# Patient Record
Sex: Female | Born: 1975 | Race: White | Hispanic: No | Marital: Married | Smoking: Never smoker
Health system: Southern US, Community
[De-identification: ages and names within clinical notes are randomized; demographics above are authoritative.]

## PROBLEM LIST (undated history)

## (undated) DIAGNOSIS — E0500 Thyrotoxicosis with diffuse goiter without thyrotoxic crisis or storm: Secondary | ICD-10-CM

## (undated) DIAGNOSIS — F419 Anxiety disorder, unspecified: Secondary | ICD-10-CM

## (undated) HISTORY — PX: FOOT SURGERY: SHX648

---

## 2001-06-02 ENCOUNTER — Other Ambulatory Visit: Admission: RE | Admit: 2001-06-02 | Discharge: 2001-06-02 | Payer: Self-pay | Admitting: Obstetrics and Gynecology

## 2003-01-16 ENCOUNTER — Other Ambulatory Visit: Admission: RE | Admit: 2003-01-16 | Discharge: 2003-01-16 | Payer: Self-pay | Admitting: Obstetrics and Gynecology

## 2004-10-14 ENCOUNTER — Other Ambulatory Visit: Admission: RE | Admit: 2004-10-14 | Discharge: 2004-10-14 | Payer: Self-pay | Admitting: Obstetrics and Gynecology

## 2005-05-04 ENCOUNTER — Other Ambulatory Visit: Admission: RE | Admit: 2005-05-04 | Discharge: 2005-05-04 | Payer: Self-pay | Admitting: Obstetrics and Gynecology

## 2010-01-14 ENCOUNTER — Encounter (HOSPITAL_COMMUNITY)
Admission: RE | Admit: 2010-01-14 | Discharge: 2010-03-29 | Payer: Self-pay | Source: Home / Self Care | Attending: Family Medicine | Admitting: Family Medicine

## 2010-06-10 LAB — HCG, SERUM, QUALITATIVE: Preg, Serum: NEGATIVE

## 2012-12-07 DIAGNOSIS — D229 Melanocytic nevi, unspecified: Secondary | ICD-10-CM

## 2012-12-07 HISTORY — DX: Melanocytic nevi, unspecified: D22.9

## 2014-08-14 ENCOUNTER — Other Ambulatory Visit: Payer: Self-pay | Admitting: Obstetrics and Gynecology

## 2014-08-14 DIAGNOSIS — N6489 Other specified disorders of breast: Secondary | ICD-10-CM

## 2014-08-15 ENCOUNTER — Ambulatory Visit
Admission: RE | Admit: 2014-08-15 | Discharge: 2014-08-15 | Disposition: A | Discharge status: Home / Self Care | Payer: BLUE CROSS/BLUE SHIELD | Source: Ambulatory Visit | Attending: Obstetrics and Gynecology | Admitting: Obstetrics and Gynecology

## 2014-08-15 DIAGNOSIS — N6489 Other specified disorders of breast: Secondary | ICD-10-CM

## 2015-07-23 ENCOUNTER — Other Ambulatory Visit: Payer: Self-pay | Admitting: Family Medicine

## 2015-07-23 ENCOUNTER — Ambulatory Visit
Admission: RE | Admit: 2015-07-23 | Discharge: 2015-07-23 | Disposition: A | Discharge status: Home / Self Care | Payer: BLUE CROSS/BLUE SHIELD | Source: Ambulatory Visit | Attending: Family Medicine | Admitting: Family Medicine

## 2015-07-23 DIAGNOSIS — M25561 Pain in right knee: Secondary | ICD-10-CM

## 2015-07-23 DIAGNOSIS — M25562 Pain in left knee: Principal | ICD-10-CM

## 2016-05-13 IMAGING — CR DG KNEE COMPLETE 4+V*L*
4 series · 4 of 4 positions shown · non-contrast
Comparison: No recent prior.

CLINICAL DATA: Knee pain for several months. No known injury.
Initial evaluation .

EXAM:
LEFT KNEE - COMPLETE 4+ VIEW

[w knee ap left]
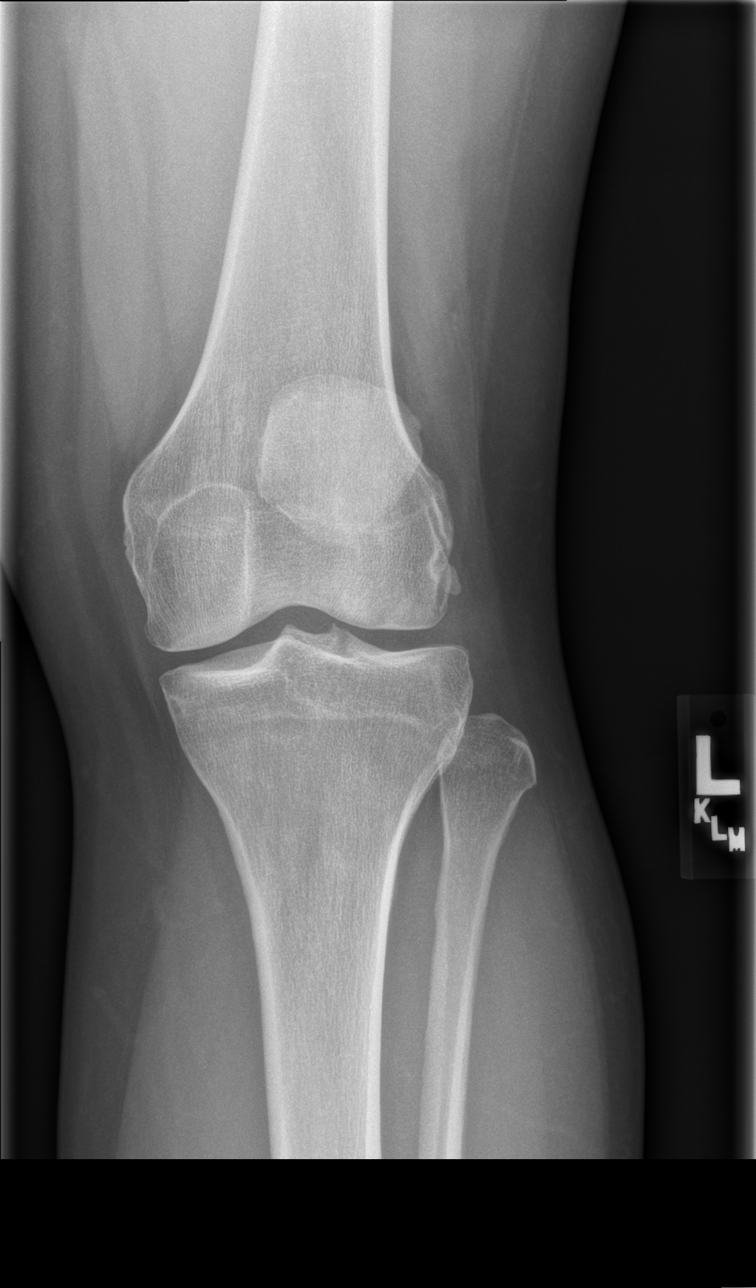

[w knee lat left]
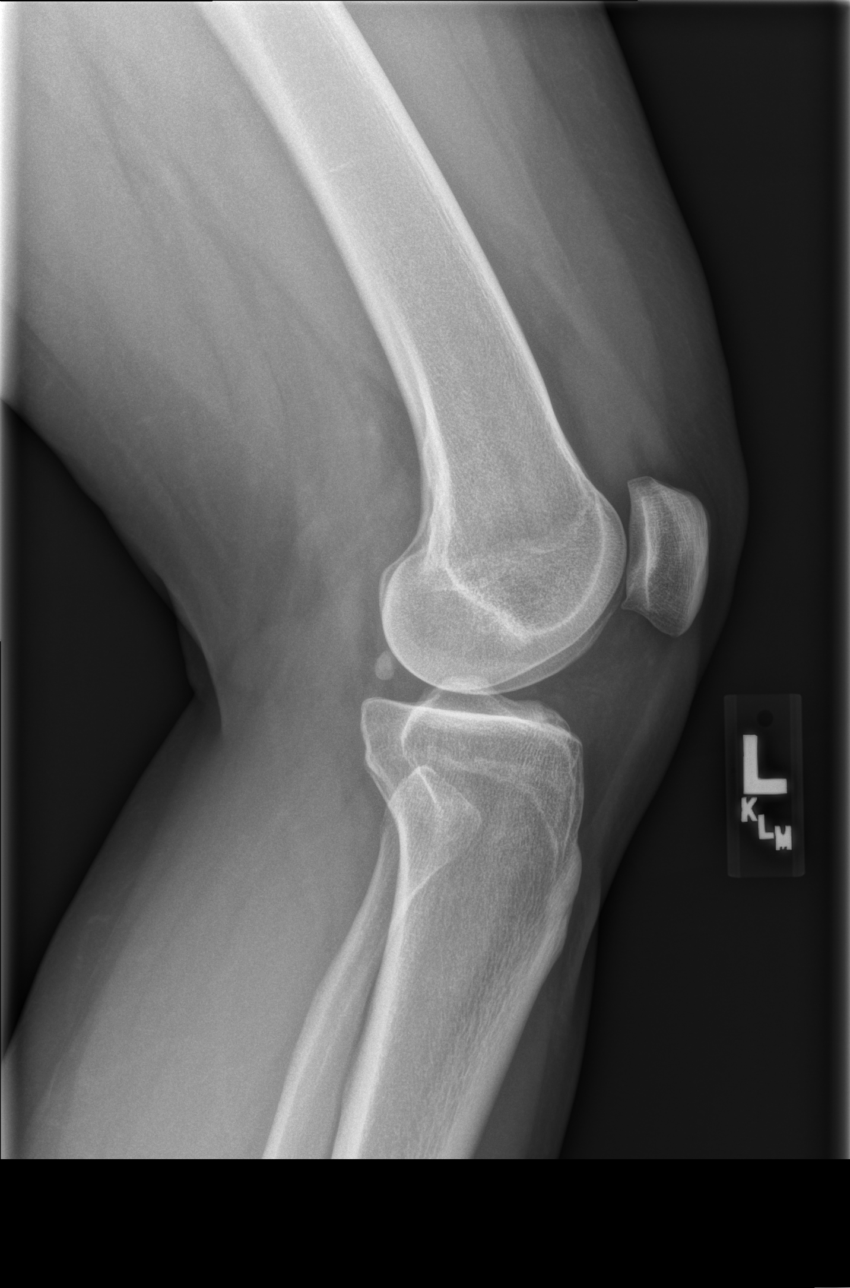

[w knee tunnel pa left]
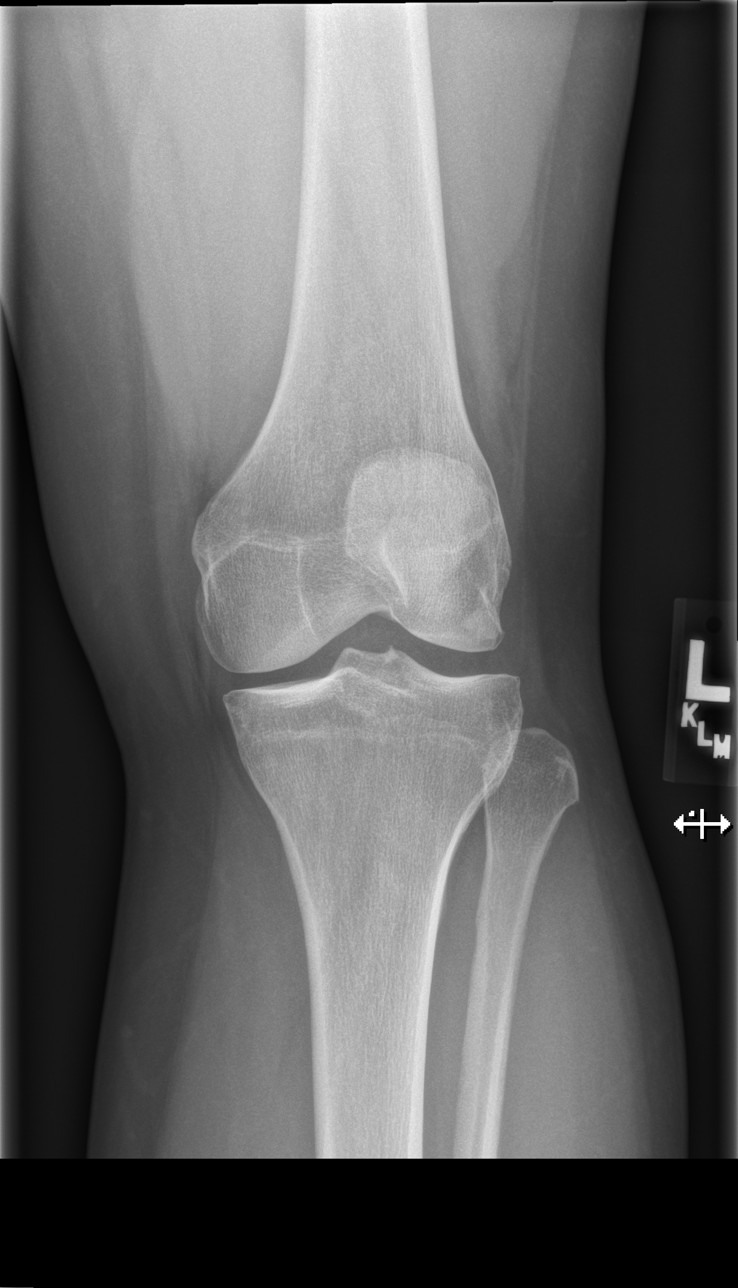

[x knee sunrise left]
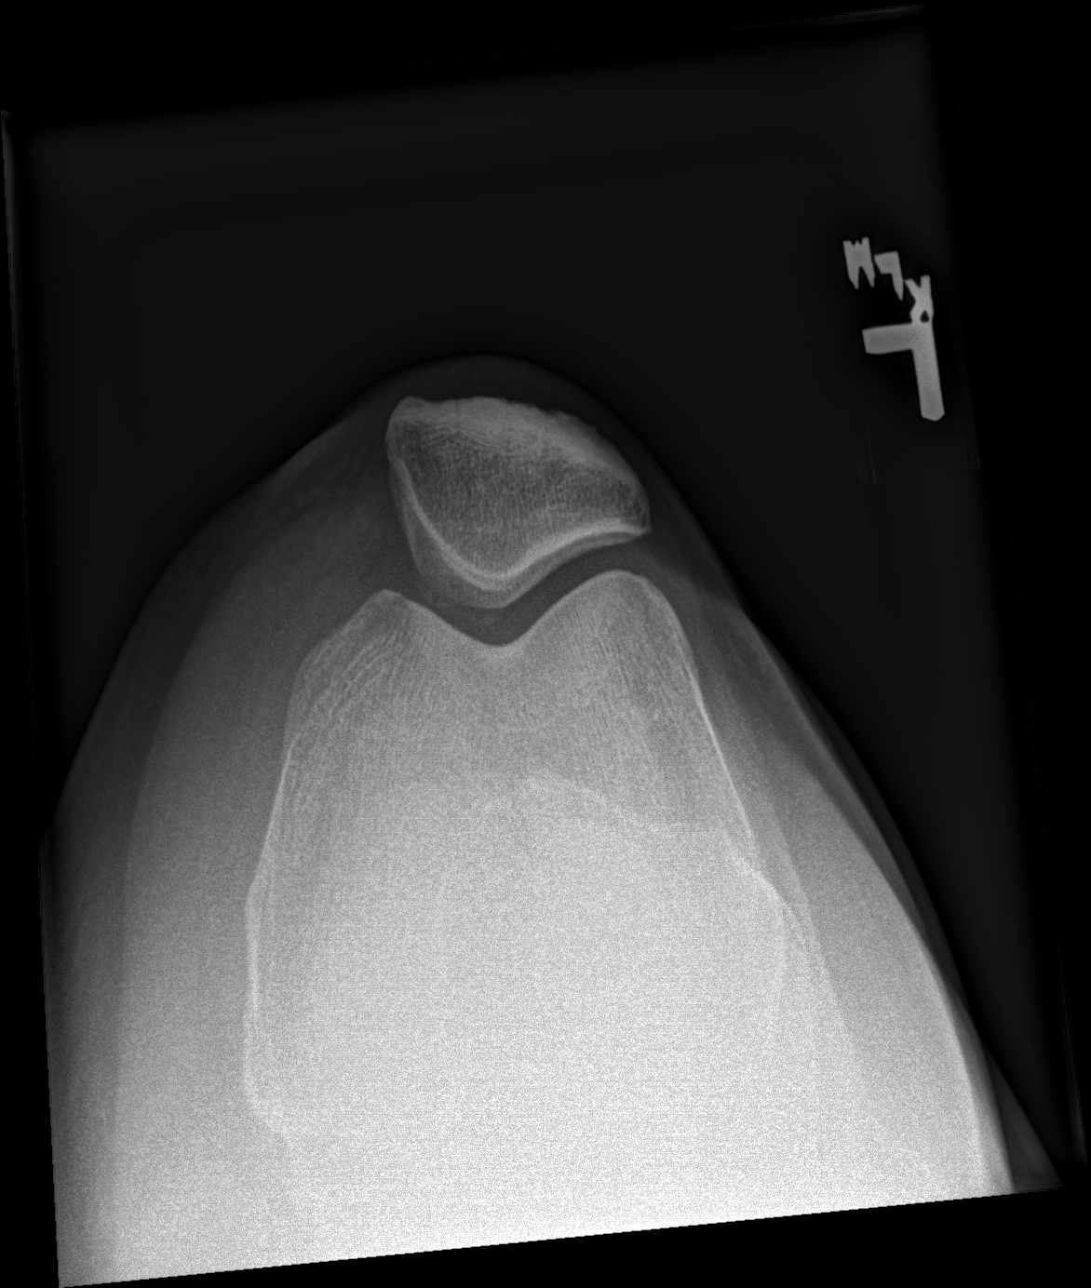

[4 of 4 positions shown; findings below may reference images not displayed]

FINDINGS: No acute bony abnormality identified. No evidence of fracture
dislocation. Small knee joint effusion. Mild patellofemoral
degenerative change pre
IMPRESSION: 1. Mild patellofemoral degenerative change. No acute bony
abnormality.

2. Small knee joint effusion.

## 2016-11-19 ENCOUNTER — Other Ambulatory Visit: Payer: Self-pay | Admitting: Obstetrics and Gynecology

## 2016-11-19 DIAGNOSIS — R928 Other abnormal and inconclusive findings on diagnostic imaging of breast: Secondary | ICD-10-CM

## 2016-12-01 ENCOUNTER — Ambulatory Visit
Admission: RE | Admit: 2016-12-01 | Discharge: 2016-12-01 | Disposition: A | Discharge status: Home / Self Care | Payer: Commercial Managed Care - PPO | Source: Ambulatory Visit | Attending: Obstetrics and Gynecology | Admitting: Obstetrics and Gynecology

## 2016-12-01 ENCOUNTER — Other Ambulatory Visit: Payer: Self-pay | Admitting: Obstetrics and Gynecology

## 2016-12-01 DIAGNOSIS — N6310 Unspecified lump in the right breast, unspecified quadrant: Secondary | ICD-10-CM

## 2016-12-01 DIAGNOSIS — R928 Other abnormal and inconclusive findings on diagnostic imaging of breast: Secondary | ICD-10-CM

## 2017-06-01 ENCOUNTER — Other Ambulatory Visit: Payer: Self-pay | Admitting: Obstetrics and Gynecology

## 2017-06-01 ENCOUNTER — Ambulatory Visit
Admission: RE | Admit: 2017-06-01 | Discharge: 2017-06-01 | Disposition: A | Discharge status: Home / Self Care | Payer: Commercial Managed Care - PPO | Source: Ambulatory Visit | Attending: Obstetrics and Gynecology | Admitting: Obstetrics and Gynecology

## 2017-06-01 DIAGNOSIS — N6310 Unspecified lump in the right breast, unspecified quadrant: Secondary | ICD-10-CM

## 2017-12-02 ENCOUNTER — Ambulatory Visit
Admission: RE | Admit: 2017-12-02 | Discharge: 2017-12-02 | Disposition: A | Discharge status: Home / Self Care | Payer: Commercial Managed Care - PPO | Source: Ambulatory Visit | Attending: Obstetrics and Gynecology | Admitting: Obstetrics and Gynecology

## 2017-12-02 ENCOUNTER — Other Ambulatory Visit: Payer: Self-pay | Admitting: Obstetrics and Gynecology

## 2017-12-02 DIAGNOSIS — N6310 Unspecified lump in the right breast, unspecified quadrant: Secondary | ICD-10-CM

## 2018-10-14 ENCOUNTER — Other Ambulatory Visit: Payer: Self-pay | Admitting: Obstetrics and Gynecology

## 2018-10-14 DIAGNOSIS — N6310 Unspecified lump in the right breast, unspecified quadrant: Secondary | ICD-10-CM

## 2018-10-19 DIAGNOSIS — E039 Hypothyroidism, unspecified: Secondary | ICD-10-CM | POA: Insufficient documentation

## 2018-10-19 DIAGNOSIS — F32A Depression, unspecified: Secondary | ICD-10-CM | POA: Insufficient documentation

## 2018-10-19 DIAGNOSIS — E0500 Thyrotoxicosis with diffuse goiter without thyrotoxic crisis or storm: Secondary | ICD-10-CM | POA: Insufficient documentation

## 2018-10-19 DIAGNOSIS — F419 Anxiety disorder, unspecified: Secondary | ICD-10-CM | POA: Insufficient documentation

## 2018-10-19 DIAGNOSIS — F329 Major depressive disorder, single episode, unspecified: Secondary | ICD-10-CM | POA: Insufficient documentation

## 2018-11-18 ENCOUNTER — Other Ambulatory Visit: Payer: Self-pay

## 2018-11-18 DIAGNOSIS — Z20822 Contact with and (suspected) exposure to covid-19: Secondary | ICD-10-CM

## 2018-11-19 LAB — NOVEL CORONAVIRUS, NAA: SARS-CoV-2, NAA: NOT DETECTED

## 2018-12-06 ENCOUNTER — Ambulatory Visit
Admission: RE | Admit: 2018-12-06 | Discharge: 2018-12-06 | Disposition: A | Discharge status: Home / Self Care | Payer: Commercial Managed Care - PPO | Source: Ambulatory Visit | Attending: Obstetrics and Gynecology | Admitting: Obstetrics and Gynecology

## 2018-12-06 ENCOUNTER — Other Ambulatory Visit: Payer: Self-pay

## 2018-12-06 DIAGNOSIS — N6310 Unspecified lump in the right breast, unspecified quadrant: Secondary | ICD-10-CM

## 2019-04-18 ENCOUNTER — Ambulatory Visit (INDEPENDENT_AMBULATORY_CARE_PROVIDER_SITE_OTHER): Payer: Commercial Managed Care - PPO

## 2019-04-18 ENCOUNTER — Encounter: Payer: Self-pay | Admitting: Podiatry

## 2019-04-18 ENCOUNTER — Other Ambulatory Visit: Payer: Self-pay

## 2019-04-18 ENCOUNTER — Ambulatory Visit (INDEPENDENT_AMBULATORY_CARE_PROVIDER_SITE_OTHER): Payer: Commercial Managed Care - PPO | Admitting: Podiatry

## 2019-04-18 VITALS — BP 109/71 | HR 72 | Resp 16

## 2019-04-18 DIAGNOSIS — M722 Plantar fascial fibromatosis: Secondary | ICD-10-CM

## 2019-04-19 NOTE — Progress Notes (Signed)
  Subjective:  Patient ID: Kathy Stone, female    DOB: Oct 16, 1975,  MRN: [id number] HPI Chief Complaint  Patient presents with  . Foot Pain    Arch right - large knot x year, gotten bigger and increased discomfort, intense pain is intermittent  . New Patient (Initial Visit)    44 y.o. female presents with the above complaint.   ROS: Denies fever chills nausea vomiting muscle aches pains calf pain back pain chest pain shortness of breath.  No past medical history on file. No past surgical history on file.  Current Outpatient Medications:  .  FLUoxetine (PROZAC) 40 MG capsule, Take 40 mg by mouth daily., Disp: , Rfl:  .  levothyroxine (SYNTHROID) 100 MCG tablet, Take 100 mcg by mouth daily., Disp: , Rfl:  .  liothyronine (CYTOMEL) 5 MCG tablet, Take 5 mcg by mouth daily., Disp: , Rfl:   No Known Allergies Review of Systems Objective:   Vitals:   04/18/19 0902  BP: 109/71  Pulse: 72  Resp: 16    General: Well developed, nourished, in no acute distress, alert and oriented x3   Dermatological: Skin is warm, dry and supple bilateral. Nails x 10 are well maintained; remaining integument appears unremarkable at this time. There are no open sores, no preulcerative lesions, no rash or signs of infection present.  Vascular: Dorsalis Pedis artery and Posterior Tibial artery pedal pulses are 2/4 bilateral with immedate capillary fill time. Pedal hair growth present. No varicosities and no lower extremity edema present bilateral.   Neruologic: Grossly intact via light touch bilateral. Vibratory intact via tuning fork bilateral. Protective threshold with Semmes Wienstein monofilament intact to all pedal sites bilateral. Patellar and Achilles deep tendon reflexes 2+ bilateral. No Babinski or clonus noted bilateral.   Musculoskeletal: No gross boney pedal deformities bilateral. No pain, crepitus, or limitation noted with foot and ankle range of motion bilateral. Muscular strength 5/5  in all groups tested bilateral.  Large nonpulsatile mass to the plantar forefoot right.  Nonpulsatile it is firm does not appear to be attached to the skin at this point.  Gait: Unassisted, Nonantalgic.    Radiographs:  Radiographs taken today demonstrate a large mass to the plantar aspect of the forefoot right.  Measuring greater than 4 cm.  No calcification.  Assessment & Plan:   Assessment: Plantar fibromatosis right foot.  Plan: Discussed etiology pathology conservative versus surgical therapy at this point I feel that we should try an injection which we did today with 20 mg of Kenalog 5 mg Marcaine to the lesion itself I will follow-up with her in 4 to 6 weeks.     Noris Kulinski T. [place], [place]

## 2019-06-06 ENCOUNTER — Ambulatory Visit: Payer: Commercial Managed Care - PPO | Admitting: Podiatry

## 2020-03-05 ENCOUNTER — Other Ambulatory Visit: Payer: Self-pay

## 2020-03-05 ENCOUNTER — Ambulatory Visit (INDEPENDENT_AMBULATORY_CARE_PROVIDER_SITE_OTHER): Payer: Commercial Managed Care - PPO | Admitting: Podiatry

## 2020-03-05 ENCOUNTER — Encounter: Payer: Self-pay | Admitting: Podiatry

## 2020-03-05 DIAGNOSIS — M722 Plantar fascial fibromatosis: Secondary | ICD-10-CM

## 2020-03-05 NOTE — Progress Notes (Signed)
She presents today states that I just am really ready to get rid of this thing as she is referring to the fibroma to the right foot. She states is starting to affect her ability to perform her daily activities and is becoming more more painful as time goes on states the injections did not help it at all.  Objective: Vital signs are stable alert oriented x3. Pulses are palpable. Large plantar fibroma to the plantar aspect medial longitudinal arch. It does encompass not only the medial but the l middle plantar fascial band. And is tender on palpation. No erythema edema cellulitis drainage or odor.  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  Plan: At this point we consented her today for excision plantar fibroma right foot. Answer all the questions regarding this procedure to the best of my ability in layman's terms she understood and was amenable to it signed a 3 page of the consent form. We did discuss possible postop complications which may include but not limited to postop pain bleeding swelling infection recurrence need further surgery overcorrection under correction loss of digit loss of limb loss of life. She understands that she will be nonweightbearing for minimum of 21 days. I will follow-up with her in the near future for surgical intervention.

## 2020-03-11 ENCOUNTER — Encounter: Payer: Self-pay | Admitting: Podiatry

## 2020-03-14 DIAGNOSIS — M79676 Pain in unspecified toe(s): Secondary | ICD-10-CM

## 2020-04-12 ENCOUNTER — Encounter: Payer: Self-pay | Admitting: Neurology

## 2020-04-18 ENCOUNTER — Other Ambulatory Visit: Payer: Self-pay | Admitting: Podiatry

## 2020-04-18 ENCOUNTER — Telehealth: Payer: Self-pay

## 2020-04-18 MED ORDER — OXYCODONE-ACETAMINOPHEN 10-325 MG PO TABS: 1.0000 | ORAL_TABLET | Freq: Three times a day (TID) | ORAL | 0 refills | Status: AC | PRN

## 2020-04-18 MED ORDER — CEPHALEXIN 500 MG PO CAPS: 500.0000 mg | ORAL_CAPSULE | Freq: Three times a day (TID) | ORAL | 0 refills | Status: DC

## 2020-04-18 MED ORDER — ONDANSETRON HCL 4 MG PO TABS: 4.0000 mg | ORAL_TABLET | Freq: Three times a day (TID) | ORAL | 0 refills | Status: DC | PRN

## 2020-04-18 NOTE — Telephone Encounter (Signed)
DOS 04/19/2020  PLANTAR FIBROMA RT - [id number]  [facility] EFFECTIVE DATE - 09/28/2019  PLAN DEDUCTIBLE - $1500.00 W/ $1445.00 REMAINING OUT OF POCKET - $4000.00 W/ $3945.00 REMAINING COPAY $0.00 COINSURANCE - 80%  SPOKE TO PAUL T. ON 04/18/2020- AUTH # [id number] APPROVED FROM 04/19/2020 - 05/18/2020

## 2020-04-19 DIAGNOSIS — M722 Plantar fascial fibromatosis: Secondary | ICD-10-CM

## 2020-04-24 ENCOUNTER — Encounter: Payer: Self-pay | Admitting: Podiatry

## 2020-04-25 ENCOUNTER — Ambulatory Visit (INDEPENDENT_AMBULATORY_CARE_PROVIDER_SITE_OTHER): Payer: Commercial Managed Care - PPO | Admitting: Podiatry

## 2020-04-25 ENCOUNTER — Other Ambulatory Visit: Payer: Self-pay

## 2020-04-25 ENCOUNTER — Encounter: Payer: Self-pay | Admitting: Podiatry

## 2020-04-25 DIAGNOSIS — M722 Plantar fascial fibromatosis: Secondary | ICD-10-CM

## 2020-04-25 DIAGNOSIS — Z9889 Other specified postprocedural states: Secondary | ICD-10-CM

## 2020-04-26 NOTE — Progress Notes (Signed)
She presents today for her first postop visit date of surgery April 19, 2020 excision of plantar fibroma of the right foot.  She states is doing pretty well she denies having to take any oral medications at all she states that she has had no pain whatsoever.  Continues to utilize her crutches and knee scooter.  Objective: Vital signs are stable she is alert oriented x3 dress sterile dressing intact once removed demonstrates no erythema no edema cellulitis drainage odor staples and sutures are intact.  Margins are well coapted.  Pathology has come back positive for plantar fibromatosis no malignancy.  Assessment: Well-healing surgical foot excision plantar fibroma per pathology.  Plan: Redressed today dressed a compressive dressing follow-up with her in 1 to 2weeks for reevaluation sutures will not be removed until 21 days postoperatively.

## 2020-05-02 ENCOUNTER — Other Ambulatory Visit: Payer: Self-pay

## 2020-05-02 ENCOUNTER — Ambulatory Visit (INDEPENDENT_AMBULATORY_CARE_PROVIDER_SITE_OTHER): Payer: Commercial Managed Care - PPO | Admitting: Podiatry

## 2020-05-02 DIAGNOSIS — M722 Plantar fascial fibromatosis: Secondary | ICD-10-CM

## 2020-05-02 DIAGNOSIS — Z9889 Other specified postprocedural states: Secondary | ICD-10-CM

## 2020-05-05 ENCOUNTER — Encounter: Payer: Self-pay | Admitting: Podiatry

## 2020-05-05 NOTE — Progress Notes (Signed)
  Subjective:  Patient ID: Kathy Stone, female    DOB: [birth year],  MRN: [id number]  Chief Complaint  Patient presents with  . Routine Post Op    PT stated that she is doing well she has no major concerns at this time.    DOS: 04/19/2020 Procedure: Excision of plantar fibroma of right foot  45 y.o. female returns for post-op check.  Doing well, minimal pain  Review of Systems: Negative except as noted in the HPI. Denies N/V/F/Ch.   Objective:  There were no vitals filed for this visit. There is no height or weight on file to calculate BMI. Constitutional Well developed. Well nourished.  Vascular Foot warm and well perfused. Capillary refill normal to all digits.   Neurologic Normal speech. Oriented to person, place, and time. Epicritic sensation to light touch grossly present bilaterally.  Dermatologic Skin healing well without signs of infection. Skin edges well coapted without signs of infection.  Orthopedic: Tenderness to palpation noted about the surgical site.    Assessment:   1. Plantar fascial fibromatosis of right foot   2. Status post right foot surgery    Plan:  Patient was evaluated and treated and all questions answered.  S/p foot surgery right -Progressing as expected post-operatively. -WB Status: NWB  -Sutures: Sutures and staples to be left intact per Dr. Milinda Pointer until next week. -Medications: No refills required -Foot redressed.  Return in about 1 week (around 05/09/2020) for suture removal with Dr Milinda Pointer.

## 2020-05-09 ENCOUNTER — Encounter: Payer: Self-pay | Admitting: Podiatry

## 2020-05-09 ENCOUNTER — Ambulatory Visit (INDEPENDENT_AMBULATORY_CARE_PROVIDER_SITE_OTHER): Payer: Commercial Managed Care - PPO | Admitting: Podiatry

## 2020-05-09 ENCOUNTER — Other Ambulatory Visit: Payer: Self-pay | Admitting: Obstetrics and Gynecology

## 2020-05-09 ENCOUNTER — Other Ambulatory Visit: Payer: Self-pay

## 2020-05-09 DIAGNOSIS — R928 Other abnormal and inconclusive findings on diagnostic imaging of breast: Secondary | ICD-10-CM

## 2020-05-09 DIAGNOSIS — Z9889 Other specified postprocedural states: Secondary | ICD-10-CM

## 2020-05-09 DIAGNOSIS — M722 Plantar fascial fibromatosis: Secondary | ICD-10-CM

## 2020-05-11 NOTE — Progress Notes (Signed)
She presents today for follow-up of excision plantar fibroma date of surgery was 04/19/2020.  She states that is feeling okay just a little numbness in areas but all in all he feels great.  Objective: Vital signs are stable she is alert oriented times 3 sutures are intact staples are intact margins well coapted we removed all of the staples and sutures today margins remain well coapted.  Assessment: Well-healing excision plantar fibroma right foot.  Plan: I will allow her to start getting this wet tomorrow a lot of the scab material will most likely fall off I see no signs of infection whatsoever I did provide her with a compression anklet to keep the edema down that will certainly come with her ambulating.  I did decide to let her start ambulating with her cam walker initially progressing to a tennis shoe and I will follow-up with her in 2 or 3 weeks

## 2020-05-15 ENCOUNTER — Encounter: Payer: Self-pay | Admitting: Podiatry

## 2020-05-16 ENCOUNTER — Encounter: Payer: Commercial Managed Care - PPO | Admitting: Podiatry

## 2020-05-22 ENCOUNTER — Ambulatory Visit
Admission: RE | Admit: 2020-05-22 | Discharge: 2020-05-22 | Disposition: A | Discharge status: Home / Self Care | Payer: Commercial Managed Care - PPO | Source: Ambulatory Visit | Attending: Obstetrics and Gynecology | Admitting: Obstetrics and Gynecology

## 2020-05-22 ENCOUNTER — Other Ambulatory Visit: Payer: Self-pay | Admitting: Obstetrics and Gynecology

## 2020-05-22 ENCOUNTER — Ambulatory Visit: Payer: Commercial Managed Care - PPO

## 2020-05-22 ENCOUNTER — Other Ambulatory Visit: Payer: Self-pay

## 2020-05-22 DIAGNOSIS — R928 Other abnormal and inconclusive findings on diagnostic imaging of breast: Secondary | ICD-10-CM

## 2020-05-24 ENCOUNTER — Other Ambulatory Visit: Payer: Self-pay

## 2020-05-24 ENCOUNTER — Ambulatory Visit
Admission: RE | Admit: 2020-05-24 | Discharge: 2020-05-24 | Disposition: A | Discharge status: Home / Self Care | Payer: Commercial Managed Care - PPO | Source: Ambulatory Visit | Attending: Obstetrics and Gynecology | Admitting: Obstetrics and Gynecology

## 2020-05-24 ENCOUNTER — Other Ambulatory Visit: Payer: Self-pay | Admitting: Obstetrics and Gynecology

## 2020-05-24 DIAGNOSIS — R928 Other abnormal and inconclusive findings on diagnostic imaging of breast: Secondary | ICD-10-CM

## 2020-05-30 ENCOUNTER — Ambulatory Visit (INDEPENDENT_AMBULATORY_CARE_PROVIDER_SITE_OTHER): Payer: Commercial Managed Care - PPO | Admitting: Podiatry

## 2020-05-30 ENCOUNTER — Encounter: Payer: Self-pay | Admitting: Podiatry

## 2020-05-30 ENCOUNTER — Other Ambulatory Visit: Payer: Self-pay

## 2020-05-30 DIAGNOSIS — Z481 Encounter for planned postprocedural wound closure: Secondary | ICD-10-CM

## 2020-05-30 DIAGNOSIS — Z9889 Other specified postprocedural states: Secondary | ICD-10-CM

## 2020-05-30 DIAGNOSIS — M722 Plantar fascial fibromatosis: Secondary | ICD-10-CM

## 2020-05-30 MED ORDER — DOXYCYCLINE HYCLATE 100 MG PO TABS: 100.0000 mg | ORAL_TABLET | Freq: Two times a day (BID) | ORAL | 0 refills | Status: DC

## 2020-05-30 NOTE — Progress Notes (Signed)
She presents today for follow-up of her plantar fibroma date of surgery April 20, 2019 with excision of plantar fibroma of the right foot.  States that he still got a little irritation in it and a little open area I have been keeping it clean and some Neosporin on it with a bandage.  Still this hurts to walk on it though.  Objective: Vital signs are stable she is alert oriented x3 pulses are palpable in the central aspect of her incision to the plantar aspect of the medial longitudinal arch does demonstrate about a 2 cm opening which does not demonstrate any granulation tissue no purulence no malodor does not probe deep.  This possibly was a small area of involution that is just not closed up in the incision yet.  Assessment: Delayed wound healing status post excision plantar fibroma right foot.  Plan: At this point we will ask her to start putting a small amount of Betadine in the wound.  This should help dry that out considerably.  Bring the granulation tissue more superficial for epithelialization.  I will follow-up with her in a couple weeks to make sure that were not going have to get reexcise this.

## 2020-06-04 ENCOUNTER — Other Ambulatory Visit: Payer: Self-pay | Admitting: General Surgery

## 2020-06-04 DIAGNOSIS — R928 Other abnormal and inconclusive findings on diagnostic imaging of breast: Secondary | ICD-10-CM

## 2020-06-05 ENCOUNTER — Other Ambulatory Visit: Payer: Self-pay | Admitting: General Surgery

## 2020-06-05 DIAGNOSIS — R928 Other abnormal and inconclusive findings on diagnostic imaging of breast: Secondary | ICD-10-CM

## 2020-06-13 ENCOUNTER — Encounter: Payer: Commercial Managed Care - PPO | Admitting: Podiatry

## 2020-06-17 ENCOUNTER — Other Ambulatory Visit: Payer: Self-pay

## 2020-06-17 ENCOUNTER — Encounter (HOSPITAL_BASED_OUTPATIENT_CLINIC_OR_DEPARTMENT_OTHER): Payer: Self-pay | Admitting: General Surgery

## 2020-06-19 NOTE — H&P (Signed)
Kathy Stone Appointment: 06/04/2020 9:00 AM Location: [facility] Patient #: [id number] DOB: 01/26/1976 Single / Language: Kathy Stone / Race: White Female   History of Present Illness Kathy Klein MD; 06/04/2020 9:59 AM) The patient is a 45 year old female who presents with a complaint of Breast problems. Pt is a lovely 45 yo F who is referred by consultation by Dr. Shelly Bombard. She has had multiple callbacks for abnormal screening mammograms since age 51, but this is the first time she has needed a biopsy. She had a possible right breast mass this time that warranted additional information. She was found to have a 4 mm mass in the lower inner quadrant on the right. This was biopsied and was an intraductal papilloma. She denies breast pain or nipple discharge. breast density is C. She is nulliparous. The only family cancer history of which she is aware is a paternal grandmother with breast cancer in her 22s. 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   Dx mammogram/us 05/22/20 at [facility] EXAM: DIGITAL DIAGNOSTIC BILATERAL MAMMOGRAM WITH TOMOSYNTHESIS AND CAD; ULTRASOUND RIGHT BREAST LIMITED  TECHNIQUE: Bilateral digital diagnostic mammography and breast tomosynthesis was performed. The images were evaluated with computer-aided detection.; Targeted ultrasound examination of the right breast was performed  COMPARISON: Previous exam(s).  ACR Breast Density Category c: The breast tissue is heterogeneously dense, which may obscure small masses.  FINDINGS: Mammogram:  Right breast: Spot compression tomosynthesis views were performed demonstrating persistence of an oval mass measuring 0.4 cm in the lower inner quadrant.  Left breast: Spot compression tomosynthesis MLO and full field mL tomosynthesis views of the left breast performed for a questioned asymmetry seen only on the MLO view in the superior left breast. On the additional imaging the asymmetry  resolves, consistent with normal overlapping fibroglandular tissue.  Ultrasound:  Targeted ultrasound is performed in the right breast at 4 o'clock 3 cm from the nipple demonstrating a small hypoechoic mass with indistinct margins measuring 0.4 x 0.4 x 0.4 cm. No internal vascularity. This corresponds to the mammographic finding.  IMPRESSION: 1. Indeterminate right breast mass at 4 o'clock measuring 0.4 cm, possibly a complicated cyst.  2. Resolution of the questioned asymmetry seen on screening mammogram in the left breast, consistent with normal tissue.  RECOMMENDATION: Ultrasound-guided right breast aspiration/possible biopsy for the mass at 4 o'clock.  I have discussed the findings and recommendations with the patient who agrees to proceed with biopsy.  BI-RADS CATEGORY 4: Suspicious.   pathology 05/24/2020 Breast, right, needle core biopsy, 4 o'clock - INTRADUCTAL PAPILLOMA WITH USUAL DUCTAL HYPERPLASIA   Past Surgical History Emeline Gins, [place]; 06/04/2020 9:01 AM) Breast Biopsy  Right. Foot Surgery  Right.  Diagnostic Studies History Emeline Gins, [place]; 06/04/2020 9:01 AM) Colonoscopy  never Mammogram  within last year Pap Smear  1-5 years ago  Allergies Emeline Gins, CMA; 06/04/2020 9:01 AM) No Known Drug Allergies  [06/04/2020]: Allergies Reconciled   Medication History Emeline Gins, CMA; 06/04/2020 9:02 AM) Levothyroxine Sodium (100MCG Tablet, Oral) Active. Liothyronine Sodium (5MCG Tablet, Oral) Active. FLUoxetine HCl (40MG  Capsule, Oral) Active. Vitamin D (1000UNIT Tablet, Oral) Active. Medications Reconciled  Social History Emeline Gins, [place]; 06/04/2020 9:01 AM) Alcohol use  Moderate alcohol use. Caffeine use  Coffee. No drug use  Tobacco use  Never smoker.  Family History Emeline Gins, [place]; 06/04/2020 9:01 AM) Arthritis  Family Members In General. Colon Polyps  Mother. Depression  Family Members In  General. Diabetes Mellitus  Family Members  In General. Ischemic Bowel Disease  Mother. Migraine Headache  Mother.  Pregnancy / Birth History Emeline Gins, [place]; 06/04/2020 9:01 AM) Age at menarche  50 years. Contraceptive History  Intrauterine device. Gravida  0 Para  0  Other Problems Emeline Gins, [place]; 06/04/2020 9:01 AM) Anxiety Disorder  Arthritis  Depression  Gastroesophageal Reflux Disease  Lump In Breast  Thyroid Disease     Review of Systems Emeline Gins CMA; 06/04/2020 9:01 AM) General Not Present- Appetite Loss, Chills, Fatigue, Fever, Night Sweats, Weight Gain and Weight Loss. Skin Not Present- Change in Wart/Mole, Dryness, Hives, Jaundice, New Lesions, Non-Healing Wounds, Rash and Ulcer. HEENT Present- Wears glasses/contact lenses. Not Present- Earache, Hearing Loss, Hoarseness, Nose Bleed, Oral Ulcers, Ringing in the Ears, Seasonal Allergies, Sinus Pain, Sore Throat, Visual Disturbances and Yellow Eyes. Respiratory Not Present- Bloody sputum, Chronic Cough, Difficulty Breathing, Snoring and Wheezing. Breast Present- Breast Mass. Not Present- Breast Pain, Nipple Discharge and Skin Changes. Cardiovascular Not Present- Chest Pain, Difficulty Breathing Lying Down, Leg Cramps, Palpitations, Rapid Heart Rate, Shortness of Breath and Swelling of Extremities. Gastrointestinal Not Present- Abdominal Pain, Bloating, Bloody Stool, Change in Bowel Habits, Chronic diarrhea, Constipation, Difficulty Swallowing, Excessive gas, Gets full quickly at meals, Hemorrhoids, Indigestion, Nausea, Rectal Pain and Vomiting. Female Genitourinary Not Present- Frequency, Nocturia, Painful Urination, Pelvic Pain and Urgency. Musculoskeletal Not Present- Back Pain, Joint Pain, Joint Stiffness, Muscle Pain, Muscle Weakness and Swelling of Extremities. Neurological Not Present- Decreased Memory, Fainting, Headaches, Numbness, Seizures, Tingling, Tremor, Trouble walking and  Weakness. Psychiatric Not Present- Anxiety, Bipolar, Change in Sleep Pattern, Depression, Fearful and Frequent crying. Endocrine Not Present- Cold Intolerance, Excessive Hunger, Hair Changes, Heat Intolerance, Hot flashes and New Diabetes. Hematology Not Present- Blood Thinners, Easy Bruising, Excessive bleeding, Gland problems, HIV and Persistent Infections.  Vitals Emeline Gins CMA; 06/04/2020 9:01 AM) 06/04/2020 9:01 AM Weight: 166.2 lb Height: 64in Body Surface Area: 1.81 m Body Mass Index: 28.53 kg/m  Temp.: 31F  Pulse: 77 (Regular)  BP: 118/82(Sitting, Left Arm, Standard)       Physical Exam Kathy Klein MD; 06/04/2020 10:01 AM) General Mental Status-Alert. General Appearance-Consistent with stated age. Hydration-Well hydrated. Voice-Normal.  Head and Neck Head-normocephalic, atraumatic with no lesions or palpable masses. Trachea-midline. Thyroid Gland Characteristics - normal size and consistency.  Eye Eyeball - Bilateral-Extraocular movements intact. Sclera/Conjunctiva - Bilateral-No scleral icterus.  Chest and Lung Exam Chest and lung exam reveals -quiet, even and easy respiratory effort with no use of accessory muscles and on auscultation, normal breath sounds, no adventitious sounds and normal vocal resonance. Inspection Chest Wall - Normal. Back - normal.  Breast Note: relatively dense breasts. both nipples are inverted slightly which she says has always been true. No palpable masses. no skin dimpling. no LAD. no nipple discharge   Cardiovascular Cardiovascular examination reveals -normal heart sounds, regular rate and rhythm with no murmurs and normal pedal pulses bilaterally.  Abdomen Inspection Inspection of the abdomen reveals - No Hernias. Palpation/Percussion Palpation and Percussion of the abdomen reveal - Soft, Non Tender, No Rebound tenderness, No Rigidity (guarding) and No  hepatosplenomegaly. Auscultation Auscultation of the abdomen reveals - Bowel sounds normal.  Neurologic Neurologic evaluation reveals -alert and oriented x 3 with no impairment of recent or remote memory. Mental Status-Normal.  Musculoskeletal Global Assessment -Note: no gross deformities.  Normal Exam - Left-Upper Extremity Strength Normal and Lower Extremity Strength Normal. Normal Exam - Right-Upper Extremity Strength Normal and Lower Extremity Strength Normal.  Lymphatic Head & Neck  General  Head & Neck Lymphatics: Bilateral - Description - Normal. Axillary  General Axillary Region: Bilateral - Description - Normal. Tenderness - Non Tender. Femoral & Inguinal  Generalized Femoral & Inguinal Lymphatics: Bilateral - Description - No Generalized lymphadenopathy.    Assessment & Plan Kathy Klein MD; 06/04/2020 9:54 AM) ABNORMAL MAMMOGRAM OF RIGHT BREAST (R92.8) Impression: Given pathology on core needle biopsy, will schedule seed localized excision.  The surgical procedure was described to the patient. I discussed the incision type and location and that we would need radiology involved on with a wire or seed marker and/or sentinel node.  The risks and benefits of the procedure were described to the patient and she wishes to proceed.  We discussed the risks bleeding, infection, damage to other structures, need for further procedures/surgeries. We discussed the risk of seroma. The patient was advised if the area in the breast in cancer, we may need to go back to surgery for additional tissue to obtain negative margins or for a lymph node biopsy. The patient was advised that these are the most common complications, but that others can occur as well. They were advised against taking aspirin or other anti-inflammatory agents/blood thinners the week before surgery. Current Plans You are being scheduled for surgery- Our schedulers will call you.  You should hear from our  office's scheduling department within 5 working days about the location, date, and time of surgery. We try to make accommodations for patient's preferences in scheduling surgery, but sometimes the OR schedule or the surgeon's schedule prevents [place] from making those accommodations.  If you have not heard from our office [phone]) in 5 working days, call the office and ask for your surgeon's nurse.  If you have other questions about your diagnosis, plan, or surgery, call the office and ask for your surgeon's nurse.  Pt Education - [facility] Breast Biopsy HCI: discussed with patient and provided information.   Signed electronically by Kathy Klein, MD (06/04/2020 10:01 AM)

## 2020-06-20 ENCOUNTER — Other Ambulatory Visit (HOSPITAL_COMMUNITY)
Admission: RE | Admit: 2020-06-20 | Discharge: 2020-06-20 | Disposition: A | Discharge status: Home / Self Care | Payer: Commercial Managed Care - PPO | Source: Ambulatory Visit | Attending: General Surgery | Admitting: General Surgery

## 2020-06-20 DIAGNOSIS — Z20822 Contact with and (suspected) exposure to covid-19: Secondary | ICD-10-CM | POA: Insufficient documentation

## 2020-06-20 DIAGNOSIS — Z01812 Encounter for preprocedural laboratory examination: Secondary | ICD-10-CM | POA: Insufficient documentation

## 2020-06-20 LAB — SARS CORONAVIRUS 2 (TAT 6-24 HRS): SARS Coronavirus 2: NEGATIVE

## 2020-06-21 ENCOUNTER — Other Ambulatory Visit: Payer: Self-pay

## 2020-06-21 ENCOUNTER — Ambulatory Visit
Admission: RE | Admit: 2020-06-21 | Discharge: 2020-06-21 | Disposition: A | Discharge status: Home / Self Care | Payer: Commercial Managed Care - PPO | Source: Ambulatory Visit | Attending: General Surgery | Admitting: General Surgery

## 2020-06-21 DIAGNOSIS — R928 Other abnormal and inconclusive findings on diagnostic imaging of breast: Secondary | ICD-10-CM

## 2020-06-21 NOTE — Progress Notes (Signed)

## 2020-06-24 ENCOUNTER — Ambulatory Visit (HOSPITAL_BASED_OUTPATIENT_CLINIC_OR_DEPARTMENT_OTHER)
Admission: RE | Admit: 2020-06-24 | Discharge: 2020-06-24 | Disposition: A | Discharge status: Home / Self Care | Payer: Commercial Managed Care - PPO | Attending: General Surgery | Admitting: General Surgery

## 2020-06-24 ENCOUNTER — Encounter (HOSPITAL_BASED_OUTPATIENT_CLINIC_OR_DEPARTMENT_OTHER): Payer: Self-pay | Admitting: General Surgery

## 2020-06-24 ENCOUNTER — Ambulatory Visit
Admission: RE | Admit: 2020-06-24 | Discharge: 2020-06-24 | Disposition: A | Discharge status: Home / Self Care | Payer: Commercial Managed Care - PPO | Source: Ambulatory Visit | Attending: General Surgery | Admitting: General Surgery

## 2020-06-24 ENCOUNTER — Encounter (HOSPITAL_BASED_OUTPATIENT_CLINIC_OR_DEPARTMENT_OTHER)
Admission: RE | Disposition: A | Discharge status: Home / Self Care | Payer: Self-pay | Source: Home / Self Care | Attending: General Surgery

## 2020-06-24 ENCOUNTER — Other Ambulatory Visit: Payer: Self-pay

## 2020-06-24 ENCOUNTER — Ambulatory Visit (HOSPITAL_BASED_OUTPATIENT_CLINIC_OR_DEPARTMENT_OTHER): Payer: Commercial Managed Care - PPO | Admitting: Anesthesiology

## 2020-06-24 DIAGNOSIS — Z975 Presence of (intrauterine) contraceptive device: Secondary | ICD-10-CM | POA: Insufficient documentation

## 2020-06-24 DIAGNOSIS — R928 Other abnormal and inconclusive findings on diagnostic imaging of breast: Secondary | ICD-10-CM

## 2020-06-24 DIAGNOSIS — D241 Benign neoplasm of right breast: Secondary | ICD-10-CM | POA: Diagnosis not present

## 2020-06-24 HISTORY — DX: Anxiety disorder, unspecified: F41.9

## 2020-06-24 HISTORY — PX: RADIOACTIVE SEED GUIDED EXCISIONAL BREAST BIOPSY: SHX6490

## 2020-06-24 HISTORY — DX: Thyrotoxicosis with diffuse goiter without thyrotoxic crisis or storm: E05.00

## 2020-06-24 LAB — POCT PREGNANCY, URINE: Preg Test, Ur: NEGATIVE

## 2020-06-24 SURGERY — RADIOACTIVE SEED GUIDED BREAST BIOPSY
Anesthesia: General | Site: Breast | Laterality: Right

## 2020-06-24 MED ORDER — LACTATED RINGERS IV SOLN: INTRAVENOUS | Status: DC

## 2020-06-24 MED ORDER — PROPOFOL 500 MG/50ML IV EMUL
INTRAVENOUS | Status: DC | PRN
  Administered 2020-06-24: 25 ug/kg/min via INTRAVENOUS

## 2020-06-24 MED ORDER — AMISULPRIDE (ANTIEMETIC) 5 MG/2ML IV SOLN: 10.0000 mg | Freq: Once | INTRAVENOUS | Status: DC | PRN

## 2020-06-24 MED ORDER — HYDROMORPHONE HCL 1 MG/ML IJ SOLN: 0.2500 mg | INTRAMUSCULAR | Status: DC | PRN

## 2020-06-24 MED ORDER — BUPIVACAINE-EPINEPHRINE 0.5% -1:200000 IJ SOLN
INTRAMUSCULAR | Status: DC | PRN
  Administered 2020-06-24: 30 mL

## 2020-06-24 MED ORDER — CHLORHEXIDINE GLUCONATE CLOTH 2 % EX PADS: 6.0000 | MEDICATED_PAD | Freq: Once | CUTANEOUS | Status: DC

## 2020-06-24 MED ORDER — CELECOXIB 200 MG PO CAPS
ORAL_CAPSULE | ORAL | Status: AC
  Filled 2020-06-24: qty 2

## 2020-06-24 MED ORDER — ACETAMINOPHEN 500 MG PO TABS
1000.0000 mg | ORAL_TABLET | ORAL | Status: AC
  Administered 2020-06-24: 1000 mg via ORAL

## 2020-06-24 MED ORDER — FENTANYL CITRATE (PF) 100 MCG/2ML IJ SOLN
INTRAMUSCULAR | Status: AC
  Filled 2020-06-24: qty 2

## 2020-06-24 MED ORDER — LIDOCAINE HCL (PF) 1 % IJ SOLN
INTRAMUSCULAR | Status: DC | PRN
  Administered 2020-06-24: 30 mL

## 2020-06-24 MED ORDER — ACETAMINOPHEN 500 MG PO TABS
ORAL_TABLET | ORAL | Status: AC
  Filled 2020-06-24: qty 2

## 2020-06-24 MED ORDER — LIDOCAINE HCL (PF) 1 % IJ SOLN
INTRAMUSCULAR | Status: AC
  Filled 2020-06-24: qty 30

## 2020-06-24 MED ORDER — EPHEDRINE SULFATE 50 MG/ML IJ SOLN
INTRAMUSCULAR | Status: DC | PRN
  Administered 2020-06-24: 10 mg via INTRAVENOUS

## 2020-06-24 MED ORDER — MIDAZOLAM HCL 5 MG/5ML IJ SOLN
INTRAMUSCULAR | Status: DC | PRN
  Administered 2020-06-24: 2 mg via INTRAVENOUS

## 2020-06-24 MED ORDER — CELECOXIB 200 MG PO CAPS
400.0000 mg | ORAL_CAPSULE | ORAL | Status: AC
  Administered 2020-06-24: 400 mg via ORAL

## 2020-06-24 MED ORDER — MEPERIDINE HCL 25 MG/ML IJ SOLN: 6.2500 mg | INTRAMUSCULAR | Status: DC | PRN

## 2020-06-24 MED ORDER — PROPOFOL 10 MG/ML IV BOLUS
INTRAVENOUS | Status: DC | PRN
  Administered 2020-06-24: 200 mg via INTRAVENOUS

## 2020-06-24 MED ORDER — OXYCODONE HCL 5 MG PO TABS: 5.0000 mg | ORAL_TABLET | Freq: Once | ORAL | Status: DC | PRN

## 2020-06-24 MED ORDER — FENTANYL CITRATE (PF) 100 MCG/2ML IJ SOLN
INTRAMUSCULAR | Status: DC | PRN
  Administered 2020-06-24 (×3): 50 ug via INTRAVENOUS

## 2020-06-24 MED ORDER — PHENYLEPHRINE HCL (PRESSORS) 10 MG/ML IV SOLN
INTRAVENOUS | Status: DC | PRN
  Administered 2020-06-24: 80 ug via INTRAVENOUS

## 2020-06-24 MED ORDER — DEXAMETHASONE SODIUM PHOSPHATE 4 MG/ML IJ SOLN
INTRAMUSCULAR | Status: DC | PRN
  Administered 2020-06-24: 4 mg via INTRAVENOUS

## 2020-06-24 MED ORDER — CEFAZOLIN SODIUM-DEXTROSE 2-4 GM/100ML-% IV SOLN
2.0000 g | INTRAVENOUS | Status: AC
  Administered 2020-06-24: 2 g via INTRAVENOUS

## 2020-06-24 MED ORDER — BUPIVACAINE-EPINEPHRINE (PF) 0.25% -1:200000 IJ SOLN
INTRAMUSCULAR | Status: AC
  Filled 2020-06-24: qty 30

## 2020-06-24 MED ORDER — CEFAZOLIN SODIUM-DEXTROSE 2-4 GM/100ML-% IV SOLN
INTRAVENOUS | Status: AC
  Filled 2020-06-24: qty 100

## 2020-06-24 MED ORDER — OXYCODONE HCL 5 MG/5ML PO SOLN: 5.0000 mg | Freq: Once | ORAL | Status: DC | PRN

## 2020-06-24 MED ORDER — LIDOCAINE 2% (20 MG/ML) 5 ML SYRINGE
INTRAMUSCULAR | Status: DC | PRN
  Administered 2020-06-24: 60 mg via INTRAVENOUS

## 2020-06-24 MED ORDER — MIDAZOLAM HCL 2 MG/2ML IJ SOLN
INTRAMUSCULAR | Status: AC
  Filled 2020-06-24: qty 2

## 2020-06-24 MED ORDER — ONDANSETRON HCL 4 MG/2ML IJ SOLN
INTRAMUSCULAR | Status: DC | PRN
  Administered 2020-06-24: 4 mg via INTRAVENOUS

## 2020-06-24 SURGICAL SUPPLY — 55 items
ADH SKN CLS APL DERMABOND .7 (GAUZE/BANDAGES/DRESSINGS) ×1
APL PRP STRL LF DISP 70% ISPRP (MISCELLANEOUS) ×1
BINDER BREAST LRG (GAUZE/BANDAGES/DRESSINGS) ×1 IMPLANT
BLADE SURG 10 STRL SS (BLADE) ×2 IMPLANT
BLADE SURG 15 STRL LF DISP TIS (BLADE) IMPLANT
BLADE SURG 15 STRL SS (BLADE)
CANISTER SUC SOCK COL 7IN (MISCELLANEOUS) IMPLANT
CANISTER SUCT 1200ML W/VALVE (MISCELLANEOUS) ×2 IMPLANT
CHLORAPREP W/TINT 26 (MISCELLANEOUS) ×2 IMPLANT
CLIP VESOCCLUDE LG 6/CT (CLIP) ×2 IMPLANT
COVER BACK TABLE 60X90IN (DRAPES) ×2 IMPLANT
COVER MAYO STAND STRL (DRAPES) ×2 IMPLANT
COVER PROBE W GEL 5X96 (DRAPES) ×2 IMPLANT
COVER WAND RF STERILE (DRAPES) IMPLANT
DECANTER SPIKE VIAL GLASS SM (MISCELLANEOUS) IMPLANT
DERMABOND ADVANCED (GAUZE/BANDAGES/DRESSINGS) ×1
DERMABOND ADVANCED .7 DNX12 (GAUZE/BANDAGES/DRESSINGS) ×1 IMPLANT
DRAPE LAPAROSCOPIC ABDOMINAL (DRAPES) ×2 IMPLANT
DRAPE UTILITY XL STRL (DRAPES) ×2 IMPLANT
ELECT COATED BLADE 2.86 ST (ELECTRODE) ×2 IMPLANT
ELECT REM PT RETURN 9FT ADLT (ELECTROSURGICAL) ×2
ELECTRODE REM PT RTRN 9FT ADLT (ELECTROSURGICAL) ×1 IMPLANT
GAUZE SPONGE 4X4 12PLY STRL LF (GAUZE/BANDAGES/DRESSINGS) ×2 IMPLANT
GLOVE SURG ENC MOIS LTX SZ6 (GLOVE) ×2 IMPLANT
GLOVE SURG LTX SZ6.5 (GLOVE) ×1 IMPLANT
GLOVE SURG SS PI 8.5 STRL IVOR (GLOVE) ×1
GLOVE SURG SS PI 8.5 STRL STRW (GLOVE) IMPLANT
GLOVE SURG UNDER POLY LF SZ6.5 (GLOVE) ×3 IMPLANT
GLOVE SURG UNDER POLY LF SZ7 (GLOVE) ×1 IMPLANT
GOWN STRL REUS W/ TWL LRG LVL3 (GOWN DISPOSABLE) ×1 IMPLANT
GOWN STRL REUS W/TWL 2XL LVL3 (GOWN DISPOSABLE) ×3 IMPLANT
GOWN STRL REUS W/TWL LRG LVL3 (GOWN DISPOSABLE)
KIT MARKER MARGIN INK (KITS) ×2 IMPLANT
LIGHT WAVEGUIDE WIDE FLAT (MISCELLANEOUS) IMPLANT
NDL HYPO 25X1 1.5 SAFETY (NEEDLE) ×1 IMPLANT
NEEDLE HYPO 25X1 1.5 SAFETY (NEEDLE) ×2 IMPLANT
NS IRRIG 1000ML POUR BTL (IV SOLUTION) ×2 IMPLANT
PACK BASIN DAY SURGERY FS (CUSTOM PROCEDURE TRAY) ×2 IMPLANT
PENCIL SMOKE EVACUATOR (MISCELLANEOUS) ×2 IMPLANT
SLEEVE SCD COMPRESS KNEE MED (STOCKING) ×2 IMPLANT
SPONGE LAP 18X18 RF (DISPOSABLE) ×2 IMPLANT
STAPLER VISISTAT 35W (STAPLE) IMPLANT
STRIP CLOSURE SKIN 1/2X4 (GAUZE/BANDAGES/DRESSINGS) ×2 IMPLANT
SUT MON AB 4-0 PC3 18 (SUTURE) ×2 IMPLANT
SUT SILK 2 0 SH (SUTURE) IMPLANT
SUT VIC AB 2-0 SH 18 (SUTURE) IMPLANT
SUT VIC AB 3-0 SH 27 (SUTURE) ×2
SUT VIC AB 3-0 SH 27X BRD (SUTURE) ×1 IMPLANT
SUT VICRYL 3-0 CR8 SH (SUTURE) IMPLANT
SYR BULB EAR ULCER 3OZ GRN STR (SYRINGE) ×2 IMPLANT
SYR CONTROL 10ML LL (SYRINGE) ×2 IMPLANT
TOWEL GREEN STERILE FF (TOWEL DISPOSABLE) ×2 IMPLANT
TRAY FAXITRON CT DISP (TRAY / TRAY PROCEDURE) ×2 IMPLANT
TUBE CONNECTING 20X1/4 (TUBING) ×2 IMPLANT
YANKAUER SUCT BULB TIP NO VENT (SUCTIONS) ×2 IMPLANT

## 2020-06-24 NOTE — Op Note (Signed)
Right Breast Radioactive seed localized excisional biopsy  Indications: This patient presents with history of abnormal right mammogram with discordant core needle biopsy.    Pre-operative Diagnosis: abnormal right mammogram    Post-operative Diagnosis: abnormal right mammogram  Surgeon: Stark Klein   Anesthesia: General endotracheal anesthesia  ASA Class: 2  Procedure Details  The patient was seen in the Holding Room. The risks, benefits, complications, treatment options, and expected outcomes were discussed with the patient. The possibilities of bleeding, infection, the need for additional procedures, failure to diagnose a condition, and creating a complication requiring transfusion or operation were discussed with the patient. The patient concurred with the proposed plan, giving informed consent.  The site of surgery properly noted/marked. The patient was taken to Operating Room # 1, identified, and the procedure verified as right Breast seed localized excisional biopsy. A Time Out was held and the above information confirmed.  The right breast and chest were prepped and draped in standard fashion. A medial circumareolar incision was made near the previously placed radioactive seed.  Dissection was carried down around the point of maximum signal intensity. The cautery was used to perform the dissection.   The specimen was inked with the margin marker paint kit.    Specimen radiography confirmed inclusion of the mammographic lesion, the clip, and the seed.  The background signal in the breast was zero.  One clip was placed in the breast cavity.  Hemostasis was achieved with cautery.  The wound was irrigated and closed with 3-0 vicryl interrupted deep dermal sutures and 4-0 monocryl running subcuticular suture.      Sterile dressings were applied. At the end of the operation, all sponge, instrument, and needle counts were correct.  Findings: Seed, clip in specimen.  Posterior margin is  pectoralis  Estimated Blood Loss:  min         Specimens: right breast tissue with seed         Complications:  None; patient tolerated the procedure well.         Disposition: PACU - hemodynamically stable.         Condition: stable

## 2020-06-24 NOTE — Transfer of Care (Signed)
Immediate Anesthesia Transfer of Care Note  Patient: Kathy Stone  Procedure(s) Performed: RADIOACTIVE SEED GUIDED EXCISIONAL RIGHT BREAST BIOPSY (Right Breast)  Patient Location: PACU  Anesthesia Type:General  Level of Consciousness: sedated  Airway & Oxygen Therapy: Patient Spontanous Breathing and Patient connected to face mask oxygen  Post-op Assessment: Report given to RN and Post -op Vital signs reviewed and stable  Post vital signs: Reviewed and stable  Last Vitals:  Vitals Value Taken Time  BP 109/70 06/24/20 1702  Temp 36.8 C 06/24/20 1643  Pulse 83 06/24/20 1703  Resp 21 06/24/20 1703  SpO2 100 % 06/24/20 1703  Vitals shown include unvalidated device data.  Last Pain:  Vitals:   06/24/20 1702  TempSrc:   PainSc: 0-No pain         Complications: No complications documented.

## 2020-06-24 NOTE — Anesthesia Procedure Notes (Signed)
Procedure Name: LMA Insertion Date/Time: 06/24/2020 3:57 PM Performed by: Signe Colt, CRNA Pre-anesthesia Checklist: Patient identified, Emergency Drugs available, Suction available and Patient being monitored Patient Re-evaluated:Patient Re-evaluated prior to induction Oxygen Delivery Method: Circle System Utilized Preoxygenation: Pre-oxygenation with 100% oxygen Induction Type: IV induction Ventilation: Mask ventilation without difficulty LMA: LMA inserted LMA Size: 4.0 Number of attempts: 1 Airway Equipment and Method: bite block Placement Confirmation: positive ETCO2 Tube secured with: Tape Dental Injury: Teeth and Oropharynx as per pre-operative assessment

## 2020-06-24 NOTE — Discharge Instructions (Addendum)
[place] Office Phone Number [phone]  BREAST BIOPSY/ PARTIAL MASTECTOMY: POST OP INSTRUCTIONS  Always review your discharge instruction sheet given to you by the facility where your surgery was performed.  IF YOU HAVE DISABILITY OR FAMILY LEAVE FORMS, YOU MUST BRING THEM TO THE OFFICE FOR PROCESSING.  DO NOT GIVE THEM TO YOUR DOCTOR.  1. A prescription for pain medication may be given to you upon discharge.  Take your pain medication as prescribed, if needed.  If narcotic pain medicine is not needed, then you may take acetaminophen (Tylenol) or ibuprofen (Advil) as needed. 2. Take your usually prescribed medications unless otherwise directed 3. If you need a refill on your pain medication, please contact your pharmacy.  They will contact our office to request authorization.  Prescriptions will not be filled after 5pm or on week-ends. 4. You should eat very light the first 24 hours after surgery, such as soup, crackers, pudding, etc.  Resume your normal diet the day after surgery. 5. Most patients will experience some swelling and bruising in the breast.  Ice packs and a good support bra will help.  Swelling and bruising can take several days to resolve.  6. It is common to experience some constipation if taking pain medication after surgery.  Increasing fluid intake and taking a stool softener will usually help or prevent this problem from occurring.  A mild laxative (Milk of Magnesia or Miralax) should be taken according to package directions if there are no bowel movements after 48 hours. 7. Unless discharge instructions indicate otherwise, you may remove your bandages 48 hours after surgery, and you may shower at that time.  You may have steri-strips (small skin tapes) in place directly over the incision.  These strips should be left on the skin for 7-10 days.   Any sutures or staples will be removed at the office during your follow-up visit. 8. ACTIVITIES:  You may resume  regular daily activities (gradually increasing) beginning the next day.  Wearing a good support bra or sports bra (or the breast binder) minimizes pain and swelling.  You may have sexual intercourse when it is comfortable. a. You may drive when you no longer are taking prescription pain medication, you can comfortably wear a seatbelt, and you can safely maneuver your car and apply brakes. b. RETURN TO WORK:  __________1 week_______________ 9. You should see your doctor in the office for a follow-up appointment approximately two weeks after your surgery.  Your doctor's nurse will typically make your follow-up appointment when she calls you with your pathology report.  Expect your pathology report 2-3 business days after your surgery.  You may call to check if you do not hear from [place] after three days.   WHEN TO CALL YOUR DOCTOR: 1. Fever over 101.0 2. Nausea and/or vomiting. 3. Extreme swelling or bruising. 4. Continued bleeding from incision. 5. Increased pain, redness, or drainage from the incision.  The clinic staff is available to answer your questions during regular business hours.  Please don't hesitate to call and ask to speak to one of the nurses for clinical concerns.  If you have a medical emergency, go to the nearest emergency room or call 911.  A surgeon from [facility] is always on call at the hospital.  For further questions, please visit [email or web address]    Post Anesthesia Home Care Instructions  Activity: Get plenty of rest for the remainder of the day. A responsible individual must stay with you for 24  hours following the procedure.  For the next 24 hours, DO NOT: -Drive a car -Paediatric nurse -Drink alcoholic beverages -Take any medication unless instructed by your physician -Make any legal decisions or sign important papers.  Meals: Start with liquid foods such as gelatin or soup. Progress to regular foods as tolerated. Avoid greasy, spicy,  heavy foods. If nausea and/or vomiting occur, drink only clear liquids until the nausea and/or vomiting subsides. Call your physician if vomiting continues.  Special Instructions/Symptoms: Your throat may feel dry or sore from the anesthesia or the breathing tube placed in your throat during surgery. If this causes discomfort, gargle with warm salt water. The discomfort should disappear within 24 hours.  If you had a scopolamine patch placed behind your ear for the management of post- operative nausea and/or vomiting:  1. The medication in the patch is effective for 72 hours, after which it should be removed.  Wrap patch in a tissue and discard in the trash. Wash hands thoroughly with soap and water. 2. You may remove the patch earlier than 72 hours if you experience unpleasant side effects which may include dry mouth, dizziness or visual disturbances. 3. Avoid touching the patch. Wash your hands with soap and water after contact with the patch.    No Tylenol until 7:05PM.

## 2020-06-24 NOTE — Interval H&P Note (Signed)
History and Physical Interval Note:  06/24/2020 3:21 PM  Kathy Stone  has presented today for surgery, with the diagnosis of RIGHT ABNORMAL MAMMOGRAM.  The various methods of treatment have been discussed with the patient and family. After consideration of risks, benefits and other options for treatment, the patient has consented to  Procedure(s): RADIOACTIVE SEED GUIDED EXCISIONAL RIGHT BREAST BIOPSY (Right) as a surgical intervention.  The patient's history has been reviewed, patient examined, no change in status, stable for surgery.  I have reviewed the patient's chart and labs.  Questions were answered to the patient's satisfaction.     Stark Klein

## 2020-06-24 NOTE — Anesthesia Preprocedure Evaluation (Signed)
Anesthesia Evaluation  Patient identified by MRN, date of birth, ID band Patient awake    Reviewed: Allergy & Precautions, NPO status , Patient's Chart, lab work & pertinent test results  Airway Mallampati: II  TM Distance: >3 FB Neck ROM: Full    Dental  (+) Dental Advisory Given, Teeth Intact   Pulmonary neg pulmonary ROS,    Pulmonary exam normal breath sounds clear to auscultation       Cardiovascular negative cardio ROS Normal cardiovascular exam Rhythm:Regular Rate:Normal     Neuro/Psych PSYCHIATRIC DISORDERS Anxiety Depression negative neurological ROS     GI/Hepatic negative GI ROS, Neg liver ROS,   Endo/Other  Hypothyroidism   Renal/GU negative Renal ROS     Musculoskeletal negative musculoskeletal ROS (+)   Abdominal   Peds  Hematology negative hematology ROS (+)   Anesthesia Other Findings   Reproductive/Obstetrics                            Anesthesia Physical Anesthesia Plan  ASA: II  Anesthesia Plan: General   Post-op Pain Management:    Induction: Intravenous  PONV Risk Score and Plan: 4 or greater and Ondansetron, Dexamethasone, Midazolam, Diphenhydramine and Treatment may vary due to age or medical condition  Airway Management Planned: LMA  Additional Equipment:   Intra-op Plan:   Post-operative Plan: Extubation in OR  Informed Consent: I have reviewed the patients History and Physical, chart, labs and discussed the procedure including the risks, benefits and alternatives for the proposed anesthesia with the patient or authorized representative who has indicated his/her understanding and acceptance.     Dental advisory given  Plan Discussed with: CRNA  Anesthesia Plan Comments:        Anesthesia Quick Evaluation

## 2020-06-25 ENCOUNTER — Encounter (HOSPITAL_BASED_OUTPATIENT_CLINIC_OR_DEPARTMENT_OTHER): Payer: Self-pay | Admitting: General Surgery

## 2020-06-26 NOTE — Anesthesia Postprocedure Evaluation (Signed)
Anesthesia Post Note  Patient: Kathy Stone  Procedure(s) Performed: RADIOACTIVE SEED GUIDED EXCISIONAL RIGHT BREAST BIOPSY (Right Breast)     Patient location during evaluation: PACU Anesthesia Type: General Level of consciousness: awake and alert Pain management: pain level controlled Vital Signs Assessment: post-procedure vital signs reviewed and stable Respiratory status: spontaneous breathing, nonlabored ventilation, respiratory function stable and patient connected to nasal cannula oxygen Cardiovascular status: blood pressure returned to baseline and stable Postop Assessment: no apparent nausea or vomiting Anesthetic complications: no   No complications documented.  Last Vitals:  Vitals:   06/24/20 1702 06/24/20 1714  BP: 109/70 (!) 115/58  Pulse:  77  Resp:  16  Temp:  36.8 C  SpO2:  98%    Last Pain:  Vitals:   06/24/20 1300  TempSrc: Oral                 Belenda Cruise P Jemarcus Dougal

## 2020-07-01 ENCOUNTER — Ambulatory Visit: Payer: Commercial Managed Care - PPO | Admitting: Neurology

## 2020-07-01 LAB — SURGICAL PATHOLOGY

## 2020-07-23 ENCOUNTER — Ambulatory Visit (INDEPENDENT_AMBULATORY_CARE_PROVIDER_SITE_OTHER): Payer: Commercial Managed Care - PPO | Admitting: Podiatry

## 2020-07-23 ENCOUNTER — Other Ambulatory Visit: Payer: Self-pay

## 2020-07-23 ENCOUNTER — Encounter: Payer: Self-pay | Admitting: Podiatry

## 2020-07-23 DIAGNOSIS — Z9889 Other specified postprocedural states: Secondary | ICD-10-CM

## 2020-07-23 DIAGNOSIS — M722 Plantar fascial fibromatosis: Secondary | ICD-10-CM

## 2020-07-24 NOTE — Progress Notes (Signed)
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  Objective: Vital signs are stable she is alert and oriented x3.  Surgical site is gone on to heal uneventfully she is considerably tight on the plantar aspect of the foot most likely secondary to wear ahead to remove portion of the skin due to the large tumor that was taken out but she also had a considerable amount of bleeding under the area as well and she had mild dehiscence of the wound.  But all in all this is doing very well she has good full range of motion of her toes some tenderness will more than likely resolve once the scar tissue breaks down as well as the numbness and tingling.  Assessment: Well healed excision of plantar fibroma.  Considerable scar tissue.  Plan: I offered her physical therapy to help break this down and she declined at this point.  She is going to try to work on it herself a little harder should she need assistance with this we will refer her to physical therapy.  Otherwise she will follow-up with me on an as-needed basis

## 2020-08-29 ENCOUNTER — Ambulatory Visit (INDEPENDENT_AMBULATORY_CARE_PROVIDER_SITE_OTHER): Payer: Commercial Managed Care - PPO | Admitting: Physician Assistant

## 2020-08-29 ENCOUNTER — Encounter: Payer: Self-pay | Admitting: Physician Assistant

## 2020-08-29 ENCOUNTER — Other Ambulatory Visit: Payer: Self-pay

## 2020-08-29 DIAGNOSIS — L814 Other melanin hyperpigmentation: Secondary | ICD-10-CM

## 2020-08-29 DIAGNOSIS — Z1283 Encounter for screening for malignant neoplasm of skin: Secondary | ICD-10-CM | POA: Diagnosis not present

## 2020-08-29 DIAGNOSIS — L720 Epidermal cyst: Secondary | ICD-10-CM | POA: Diagnosis not present

## 2020-08-29 DIAGNOSIS — L821 Other seborrheic keratosis: Secondary | ICD-10-CM

## 2020-08-29 DIAGNOSIS — Z86018 Personal history of other benign neoplasm: Secondary | ICD-10-CM | POA: Diagnosis not present

## 2020-08-29 DIAGNOSIS — L578 Other skin changes due to chronic exposure to nonionizing radiation: Secondary | ICD-10-CM

## 2020-08-29 DIAGNOSIS — D485 Neoplasm of uncertain behavior of skin: Secondary | ICD-10-CM

## 2020-08-29 DIAGNOSIS — D1800 Hemangioma unspecified site: Secondary | ICD-10-CM

## 2020-08-29 NOTE — Progress Notes (Signed)
   Follow-Up Visit   Subjective  Kathy Stone is a 45 y.o. female who presents for the following: Annual Exam (Patient here today for yearly skin check. No concerns. Personal history of atypical mole. No personal history or melanoma or non mole skin cancer. No family history of atypical moles, melanoma, or non mole skin cancer. ).   The following portions of the chart were reviewed this encounter and updated as appropriate:  Tobacco  Allergies  Meds  Problems  Med Hx  Surg Hx  Fam Hx      Objective  Well appearing patient in no apparent distress; mood and affect are within normal limits.  A full examination was performed including scalp, head, eyes, ears, nose, lips, neck, chest, axillae, abdomen, back, buttocks, bilateral upper extremities, bilateral lower extremities, hands, feet, fingers, toes, fingernails, and toenails. All findings within normal limits unless otherwise noted below.  Objective  Right Inguinal Area: Large nodule   Assessment & Plan  Neoplasm of uncertain behavior of skin Right Inguinal Area  Skin / nail biopsy Type of biopsy: punch   Informed consent: discussed and consent obtained   Timeout: patient name, date of birth, surgical site, and procedure verified   Procedure prep:  Patient was prepped and draped in usual sterile fashion (Non sterile) Prep type:  Chlorhexidine Anesthesia: the lesion was anesthetized in a standard fashion   Anesthetic:  1% lidocaine w/ epinephrine 1-100,000 local infiltration Punch size:  8 mm Suture size:  4-0 Suture type: silk   Hemostasis achieved with: suture   Outcome: patient tolerated procedure well   Post-procedure details: sterile dressing applied and wound care instructions given   Dressing type: bandage   Additional details:  No photo no measurement per KRS. Patient states that she will remove her own sutures   Specimen 1 - Surgical pathology Differential Diagnosis: R/O Cyst  Check Margins:  No  Lentigines - Scattered tan macules - Discussed due to sun exposure - Benign, observe - Call for any changes  Seborrheic Keratoses - Stuck-on, waxy, tan-brown papules and plaques  - Discussed benign etiology and prognosis. - Observe - Call for any changes  Hemangioma- left thigh - Red papules - Discussed benign nature - Observe - Call for any changes  Actinic Damage - diffuse scaly erythematous macules with underlying dyspigmentation - Recommend daily broad spectrum sunscreen SPF 30+ to sun-exposed areas, reapply every 2 hours as needed.  - Call for new or changing lesions.  Skin cancer screening performed today.   I, Teana Lindahl, PA-C, have reviewed all documentation's for this visit.  The documentation on 08/29/20 for the exam, diagnosis, procedures and orders are all accurate and complete.

## 2020-08-29 NOTE — Patient Instructions (Signed)

## 2020-08-29 NOTE — Addendum Note (Signed)
Addended by: Robyne Askew R on: 08/29/2020 12:39 PM   Modules accepted: Level of Service

## 2020-09-26 ENCOUNTER — Ambulatory Visit: Payer: Commercial Managed Care - PPO | Admitting: Podiatry

## 2021-05-20 ENCOUNTER — Ambulatory Visit (INDEPENDENT_AMBULATORY_CARE_PROVIDER_SITE_OTHER): Payer: Commercial Managed Care - PPO | Admitting: Podiatry

## 2021-05-20 ENCOUNTER — Encounter: Payer: Self-pay | Admitting: Podiatry

## 2021-05-20 ENCOUNTER — Other Ambulatory Visit: Payer: Self-pay

## 2021-05-20 DIAGNOSIS — M722 Plantar fascial fibromatosis: Secondary | ICD-10-CM | POA: Diagnosis not present

## 2021-05-20 MED ORDER — TRIAMCINOLONE ACETONIDE 40 MG/ML IJ SUSP
40.0000 mg | Freq: Once | INTRAMUSCULAR | Status: AC
  Administered 2021-05-20: 40 mg

## 2021-05-20 NOTE — Progress Notes (Signed)
She presents with a chief concern of a another nodule in the medial arch and a lump at the proximalmost aspect of the excision of her fibroma right foot.  States it seems to be getting worse and larger in manner and is becoming more tender.  Objective: Vital signs are stable alert and oriented x3.  Pulses are palpable.  She has a less than 1 cm nodular lesion in the central band of the plantar fascia just lateral to the incision which was medial for the excision of the previous fibroma.  The proximalmost aspect of the incision does demonstrate a surrounding fibroma surrounding the proximal margins of the incision.  This 1 is larger is as well as deeper.  It is mildly more tender.  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  Plan: I injected both of these today with 10 mg Kenalog 5 mg Marcaine and local anesthetic.  Advised her to watch for thinning of the skin and cracking provided her with information what to do if this does happen she will notify us with any questions or concerns or worsening of the condition.

## 2021-06-11 DIAGNOSIS — G4730 Sleep apnea, unspecified: Secondary | ICD-10-CM | POA: Insufficient documentation

## 2021-07-31 ENCOUNTER — Encounter: Payer: Self-pay | Admitting: Physician Assistant

## 2021-07-31 ENCOUNTER — Ambulatory Visit (INDEPENDENT_AMBULATORY_CARE_PROVIDER_SITE_OTHER): Payer: Commercial Managed Care - PPO | Admitting: Physician Assistant

## 2021-07-31 DIAGNOSIS — L219 Seborrheic dermatitis, unspecified: Secondary | ICD-10-CM | POA: Diagnosis not present

## 2021-07-31 MED ORDER — KETOCONAZOLE 2 % EX CREA: 1.0000 "application " | TOPICAL_CREAM | Freq: Two times a day (BID) | CUTANEOUS | 3 refills | Status: AC

## 2021-07-31 MED ORDER — MINOCYCLINE HCL 100 MG PO CAPS: 100.0000 mg | ORAL_CAPSULE | Freq: Two times a day (BID) | ORAL | 0 refills | Status: AC

## 2021-07-31 MED ORDER — ALCLOMETASONE DIPROPIONATE 0.05 % EX CREA: TOPICAL_CREAM | Freq: Two times a day (BID) | CUTANEOUS | 3 refills | Status: AC | PRN

## 2021-08-06 ENCOUNTER — Encounter: Payer: Self-pay | Admitting: Physician Assistant

## 2021-08-06 NOTE — Progress Notes (Signed)
? ?  Follow-Up Visit ?  ?Subjective  ?Kathy Stone is a 46 y.o. female who presents for the following: Skin Problem (Patient is having some dry skin on face. She is wearing CPAP at night. She is getting married in 5 weeks. Also has a purple lesion on left thigh. Per patient is close to an area where we froze. Check lesion on back. No history of skin cancers. History of atypical mole. ). ? ? ?The following portions of the chart were reviewed this encounter and updated as appropriate:  Tobacco  Allergies  Meds  Problems  Med Hx  Surg Hx  Fam Hx   ?  ? ?Objective  ?Well appearing patient in no apparent distress; mood and affect are within normal limits. ? ?A full examination was performed including scalp, head, eyes, ears, nose, lips, neck, chest, axillae, abdomen, back, buttocks, bilateral upper extremities, bilateral lower extremities, hands, feet, fingers, toes, fingernails, and toenails. All findings within normal limits unless otherwise noted below. ? ?Left Alar Crease, Left Oral Commissure, Right Alar Crease, Right Oral Commissure ?Erythematous plaques with greasy scale.  ? ? ?Assessment & Plan  ?Seborrheic dermatitis ?Left Alar Crease; Right Alar Crease; Left Oral Commissure; Right Oral Commissure ? ?ketoconazole (NIZORAL) 2 % cream - Left Alar Crease, Left Oral Commissure, Right Alar Crease, Right Oral Commissure ?Apply 1 application. topically 2 (two) times daily. ? ?alclomethasone (ACLOVATE) 0.05 % cream - Left Alar Crease, Left Oral Commissure, Right Alar Crease, Right Oral Commissure ?Apply topically 2 (two) times daily as needed (Rash). ? ?minocycline (MINOCIN) 100 MG capsule - Left Alar Crease, Left Oral Commissure, Right Alar Crease, Right Oral Commissure ?Take 1 capsule (100 mg total) by mouth 2 (two) times daily. ? ? ? ?I, Ellah Otte, PA-C, have reviewed all documentation's for this visit.  The documentation on 08/06/21 for the exam, diagnosis, procedures and orders are all accurate  and complete. ?

## 2021-08-13 ENCOUNTER — Encounter (HOSPITAL_COMMUNITY): Payer: Self-pay

## 2021-09-03 ENCOUNTER — Telehealth: Payer: Self-pay | Admitting: *Deleted

## 2021-09-03 MED ORDER — HYDROCORTISONE 2.5 % EX CREA: TOPICAL_CREAM | Freq: Two times a day (BID) | CUTANEOUS | 11 refills | Status: AC | PRN

## 2021-09-03 NOTE — Telephone Encounter (Signed)
Insurance denied alclometasone cream- states it will cover hydrocortisone 2.5 % cream- sent to patient pharmacy.

## 2021-11-27 ENCOUNTER — Ambulatory Visit (INDEPENDENT_AMBULATORY_CARE_PROVIDER_SITE_OTHER): Payer: Commercial Managed Care - PPO | Admitting: Podiatry

## 2021-11-27 ENCOUNTER — Encounter: Payer: Self-pay | Admitting: Podiatry

## 2021-11-27 DIAGNOSIS — M722 Plantar fascial fibromatosis: Secondary | ICD-10-CM | POA: Diagnosis not present

## 2021-11-27 MED ORDER — TRIAMCINOLONE ACETONIDE 40 MG/ML IJ SUSP
40.0000 mg | Freq: Once | INTRAMUSCULAR | Status: AC
  Administered 2021-11-27: 40 mg

## 2021-11-27 NOTE — Progress Notes (Signed)
She presents today for follow-up of her plantar fibroma states that these 2 [place] started to get bigger as she refers to the plantar fibromas status post excision plantar fibromas quite some time ago.  Objective: Vital signs are stable she alert oriented x3 she has plantar fibromas 1 distal lateral incision site and 1 proximal medial incision site to the right medial longitudinal arch.  These are mildly tender on palpation greater than centimeter in diameter.  Nonpulsatile but firm and not related.  Assessment: Plantar fibromas Mi longitudinal arch x2.  Plan: I injected both of these today with 10 mg Kenalog 5 mg Marcaine point maximal tenderness.  She will follow-up with me if these do not reduce over the next 6 to 8 weeks.

## 2021-12-11 ENCOUNTER — Ambulatory Visit: Payer: Commercial Managed Care - PPO | Admitting: Podiatry

## 2022-11-03 ENCOUNTER — Ambulatory Visit: Payer: Commercial Managed Care - PPO | Admitting: Podiatry

## 2022-11-25 ENCOUNTER — Ambulatory Visit: Payer: Commercial Managed Care - PPO | Admitting: Podiatry

## 2022-11-25 ENCOUNTER — Encounter: Payer: Self-pay | Admitting: Podiatry

## 2022-11-25 ENCOUNTER — Ambulatory Visit (INDEPENDENT_AMBULATORY_CARE_PROVIDER_SITE_OTHER): Payer: Commercial Managed Care - PPO

## 2022-11-25 DIAGNOSIS — M779 Enthesopathy, unspecified: Secondary | ICD-10-CM | POA: Diagnosis not present

## 2022-11-25 DIAGNOSIS — M778 Other enthesopathies, not elsewhere classified: Secondary | ICD-10-CM

## 2022-11-25 MED ORDER — TRIAMCINOLONE ACETONIDE 10 MG/ML IJ SUSP: 10.0000 mg | Freq: Once | INTRAMUSCULAR | Status: AC

## 2022-11-25 MED ORDER — TRIAMCINOLONE ACETONIDE 10 MG/ML IJ SUSP
10.0000 mg | Freq: Once | INTRAMUSCULAR | Status: AC
Start: 2022-11-25 — End: 2022-11-25
  Administered 2022-11-25: 10 mg via INTRA_ARTICULAR

## 2022-11-25 NOTE — Progress Notes (Signed)
Subjective:   Patient ID: Kathy Stone, female   DOB: 47 y.o.   MRN: [id number]   HPI Patient presents with pain in the left foot that been present for about 6 to 8 weeks and seem to get better for short period of time then has reoccurred.  Doing well overall on the right foot did have plantar fibroma excision with slight nodular formation now but nonpainful   ROS      Objective:  Physical Exam  Neuro vascular status intact with patient left foot showing inflammation in the first interspace extending to the first MPJ lateral side and slightly to the medial side of the second MPJ     Assessment:  Inflammatory capsulitis of the first and second MPJ low-grade in intensity     Plan:  H&P x-ray reviewed and advised this patient on stiff bottom shoes anti-inflammatories.  Patient will be seen back to recheck and I went ahead today and I did do sterile prep and injected the joint surface 3 mg dexamethasone Kenalog 5 mg Xylocaine and into the tendon complex  X-rays indicate no signs of arthritis or stress fracture associated with condition

## 2023-07-08 ENCOUNTER — Ambulatory Visit (INDEPENDENT_AMBULATORY_CARE_PROVIDER_SITE_OTHER): Admitting: Podiatry

## 2023-07-08 ENCOUNTER — Encounter: Payer: Self-pay | Admitting: Podiatry

## 2023-07-08 DIAGNOSIS — M722 Plantar fascial fibromatosis: Secondary | ICD-10-CM

## 2023-07-08 DIAGNOSIS — Z8742 Personal history of other diseases of the female genital tract: Secondary | ICD-10-CM | POA: Insufficient documentation

## 2023-07-08 MED ORDER — TRIAMCINOLONE ACETONIDE 40 MG/ML IJ SUSP
20.0000 mg | Freq: Once | INTRAMUSCULAR | Status: AC
  Administered 2023-07-08: 20 mg

## 2023-07-08 NOTE — Progress Notes (Signed)
 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  She states that even after the surgery these have started to come back as she points to a larger medial lesion and a smaller distal medial lesion.  Objective: Vital signs are stable slow oriented x 3 she has a 2 cm x 2 cm nonpulsatile firm mass to the medial aspect of her medial longitudinal arch medial to the previous excision of plantar fibromas.  She also has a 1 cm lesion at the distal medial aspect of that incision.  Again firm and nonpulsatile.  Assessment plantar fibromatosis.  Plan: Injected those areas today with Kenalog and local anesthetic.  She tolerated the procedure well although it was moderately painful.  I will follow-up with her in a few weeks probably about 3 months make sure that she is improving or doing better.  We may need to consider radiological oncology or surgery.

## 2023-07-27 ENCOUNTER — Ambulatory Visit: Payer: Commercial Managed Care - PPO | Admitting: Nurse Practitioner

## 2023-09-11 LAB — LAB REPORT - SCANNED
A1c: 5.5
EGFR (Non-African Amer.): 112
TSH: 1.26 (ref 0.41–5.90)

## 2023-09-15 NOTE — Patient Instructions (Signed)

## 2023-09-16 ENCOUNTER — Encounter: Payer: Self-pay | Admitting: Nurse Practitioner

## 2023-09-16 ENCOUNTER — Ambulatory Visit (INDEPENDENT_AMBULATORY_CARE_PROVIDER_SITE_OTHER): Admitting: Nurse Practitioner

## 2023-09-16 VITALS — BP 100/74 | HR 79 | Ht 64.0 in | Wt 159.4 lb

## 2023-09-16 DIAGNOSIS — E890 Postprocedural hypothyroidism: Secondary | ICD-10-CM | POA: Diagnosis not present

## 2023-09-16 MED ORDER — LIOTHYRONINE SODIUM 5 MCG PO TABS: 5.0000 ug | ORAL_TABLET | Freq: Every day | ORAL | 3 refills | Status: AC

## 2023-09-16 MED ORDER — LEVOTHYROXINE SODIUM 100 MCG PO TABS: 100.0000 ug | ORAL_TABLET | Freq: Every day | ORAL | 3 refills | Status: AC

## 2023-09-16 NOTE — Progress Notes (Signed)
 Endocrinology Consult Note                                         09/16/2023, 2:23 PM  Subjective:   Subjective    Kathy Stone is a 48 y.o.-year-old female patient being seen in consultation for hypothyroidism referred by Kathy Duel, PA.  She did see [facility] in the past but wanted to move to a clinic closer.   Past Medical History:  Diagnosis Date   Anxiety    Atypical mole 12/07/2012   right low back   Graves disease     Past Surgical History:  Procedure Laterality Date   FOOT SURGERY     RADIOACTIVE SEED GUIDED EXCISIONAL BREAST BIOPSY Right 06/24/2020   Procedure: RADIOACTIVE SEED GUIDED EXCISIONAL RIGHT BREAST BIOPSY;  Surgeon: Lockie Rima, MD;  Location: [facility];  Service: General;  Laterality: Right;    Social History   Socioeconomic History   Marital status: Married    Spouse name: Not on file   Number of children: Not on file   Years of education: Not on file   Highest education level: Not on file  Occupational History   Not on file  Tobacco Use   Smoking status: Never   Smokeless tobacco: Never  Vaping Use   Vaping status: Never Used  Substance and Sexual Activity   Alcohol use: Yes    Comment: occas   Drug use: Never   Sexual activity: Not on file  Other Topics Concern   Not on file  Social History Narrative   Not on file   Social Drivers of Health   Financial Resource Strain: Not on file  Food Insecurity: Not on file  Transportation Needs: Not on file  Physical Activity: Not on file  Stress: Not on file  Social Connections: Not on file    Family History  Problem Relation Age of Onset   Breast cancer Paternal Grandmother     Outpatient Encounter Medications as of 09/16/2023  Medication Sig   cholecalciferol (VITAMIN D3) 25 MCG (1000 UNIT) tablet Take 1,000 Units by mouth daily.   FLUoxetine (PROZAC) 40 MG capsule Take 40 mg by  mouth daily.   hydrocortisone  2.5 % cream Apply topically 2 (two) times daily as needed (Rash).   modafinil (PROVIGIL) 100 MG tablet Take 100 mg by mouth every morning. (Patient taking differently: Take 100 mg by mouth every morning. Patient states that she takes as needed)   [DISCONTINUED] levothyroxine (SYNTHROID) 100 MCG tablet Take 100 mcg by mouth daily.   [DISCONTINUED] liothyronine (CYTOMEL) 5 MCG tablet Take 5 mcg by mouth daily.   levothyroxine (SYNTHROID) 100 MCG tablet Take 1 tablet (100 mcg total) by mouth daily before breakfast.   liothyronine (CYTOMEL) 5 MCG tablet Take 1 tablet (5 mcg total) by mouth daily.   [DISCONTINUED] loratadine (CLARITIN) 10 MG tablet Take 10 mg by mouth daily.   Facility-Administered Encounter Medications as of 09/16/2023  Medication   triamcinolone  acetonide (KENALOG )  10 MG/ML injection 10 mg    ALLERGIES: No Known Allergies VACCINATION STATUS:  There is no immunization history on file for this patient.   HPI   Kathy Stone  is a patient with the above medical history. she was diagnosed with Graves disease in 2011, had RAI ablation and was subsequently started on thyroid  hormone replacement therapy thereafter.  She noted to have hair loss after Levothyroxine initiation, thus she was started on Cytomel to help support. she was given various doses of Levothyroxine over the years, currently on 100 micrograms and Cytomel is 5 mcg.   she reports compliance to this medication:  Taking it daily on empty stomach with water, separated by >30 minutes before breakfast and other medications, and by at least 4 hours from calcium, iron, PPIs, multivitamins .  I reviewed patient's thyroid  tests:  No results found for: TSH, FREET4   Pt denies feeling nodules in neck, hoarseness, dysphagia/odynophagia, SOB with lying down.  she does family history of thyroid  disorders in her maternal aunt (goiter).  No family history of thyroid  cancer.  She does  have cousin with lupus, maternal grandfather with history of scleroderma.  No recent use of iodine supplements.  Denies use of Biotin containing supplements.  I reviewed her chart and she also has a history of anxiety/depression, OSA.  She was recently referred to neurology for strange symptoms of deja vu associated with certain smells and thinks it may be related to high stress, notes last incidence happened over 3 months ago.  She did not go to that appointment as it was not a frequent occurrence.   ROS:  Constitutional: no weight gain/loss, no fatigue, no subjective hyperthermia, no subjective hypothermia Eyes: no blurry vision, no xerophthalmia ENT: no sore throat, no nodules palpated in throat, no dysphagia/odynophagia, no hoarseness Cardiovascular: no chest pain, no SOB, no palpitations, no leg swelling Respiratory: no cough, no SOB Gastrointestinal: no nausea/vomiting/diarrhea Musculoskeletal: no muscle/joint aches Skin: no rashes Neurological: no tremors, no numbness, no tingling, no dizziness Psychiatric: no depression, no anxiety   Objective:   Objective     BP 100/74 (BP Location: Left Arm, Patient Position: Sitting, Cuff Size: Large)   Pulse 79   Ht 5' 4 (1.626 m)   Wt 159 lb 6.4 oz (72.3 kg)   BMI 27.36 kg/m  Wt Readings from Last 3 Encounters:  09/16/23 159 lb 6.4 oz (72.3 kg)  06/24/20 164 lb 3.9 oz (74.5 kg)    BP Readings from Last 3 Encounters:  09/16/23 100/74  06/24/20 (!) 115/58  04/18/19 109/71     Constitutional:  Body mass index is 27.36 kg/m., not in acute distress, normal state of mind Eyes: PERRLA, EOMI, no exophthalmos ENT: moist mucous membranes, no thyromegaly, no cervical lymphadenopathy Cardiovascular: normal precordial activity, RRR, no murmur/rubs/gallops Respiratory:  adequate breathing efforts, no gross chest deformity, Clear to auscultation bilaterally Gastrointestinal: abdomen soft, non-tender, no distension, bowel sounds  present Musculoskeletal: no gross deformities, strength intact in all four extremities Skin: moist, warm, no rashes Neurological: no tremor with outstretched hands, deep tendon reflexes normal in BLE.   CMP ( most recent) CMP  No results found for: NA, K, CL, CO2, GLUCOSE, BUN, CREATININE, CALCIUM, PROT, ALBUMIN, AST, ALT, ALKPHOS, BILITOT, GFR, EGFR, GFRNONAA   Diabetic Labs (most recent): No results found for: HGBA1C, MICROALBUR   Lipid Panel ( most recent) Lipid Panel  No results found for: CHOL, TRIG, HDL, CHOLHDL, VLDL, LDLCALC, LDLDIRECT, LABVLDL  Assessment & Plan:   ASSESSMENT / PLAN:  1. Hypothyroidism-postablative   Patient with long-standing hypothyroidism, on levothyroxine therapy. On physical exam, patient does not have gross goiter, thyroid  nodules, or neck compression symptoms.  She is advised to continue Levothyroxine 100 mcg and Cytomel 5 mcg po daily before breakfast.  Will recheck TFTs prior to next visit and adjust dose accordingly.  - We discussed about correct intake of levothyroxine, at fasting, with water, separated by at least 30 minutes from breakfast, and separated by more than 4 hours from calcium, iron, multivitamins, acid reflux medications (PPIs). -Patient is made aware of the fact that thyroid  hormone replacement is needed for life, dose to be adjusted by periodic monitoring of thyroid  function tests.    - Time spent with the patient: 45 minutes,which was spent in obtaining information about her symptoms, reviewing her previous labs, evaluations, and treatments, counseling her about her hypothyroidism, and developing a plan to confirm the diagnosis and long term treatment as necessary. Please refer to Patient Self Inventory in the Media tab for reviewed elements of pertinent patient history.  Jari Merles participated in the discussions, expressed understanding, and voiced  agreement with the above plans.  All questions were answered to her satisfaction. she is encouraged to contact clinic should she have any questions or concerns prior to her return visit.   FOLLOW UP PLAN:  Return in about 6 months (around 03/17/2024) for Thyroid  follow up, Previsit labs.  Hulon Magic, [facility] [address] [place], [place] [address] Phone: [phone] Fax: [phone]  09/16/2023, 2:23 PM

## 2023-09-30 ENCOUNTER — Ambulatory Visit: Admitting: Nurse Practitioner

## 2023-10-12 ENCOUNTER — Encounter: Payer: Self-pay | Admitting: Podiatry

## 2023-10-12 ENCOUNTER — Ambulatory Visit (INDEPENDENT_AMBULATORY_CARE_PROVIDER_SITE_OTHER): Admitting: Podiatry

## 2023-10-12 DIAGNOSIS — M722 Plantar fascial fibromatosis: Secondary | ICD-10-CM | POA: Diagnosis not present

## 2023-10-12 MED ORDER — TRIAMCINOLONE ACETONIDE 40 MG/ML IJ SUSP
20.0000 mg | Freq: Once | INTRAMUSCULAR | Status: AC
  Administered 2023-10-12: 20 mg

## 2023-10-12 NOTE — Progress Notes (Signed)
 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  States that they all have gone down to some degree I think except for maybe that is what is she is pointing to a central lesion on the lateral aspect of her plantar incision and the medial largest proximal most lesion.  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  Assessment: Plantar fibromatosis right foot.  Plan: Reinjected these areas today utilizing 30 mg of Kenalog  divided amongst all of the lesions with local anesthetic.  We did discuss possible skin breakdown and how to take care of this she will notify me with any questions or concerns.  Otherwise I will follow-up with her in about 3 months

## 2024-01-13 ENCOUNTER — Ambulatory Visit: Admitting: Podiatry

## 2024-03-10 ENCOUNTER — Telehealth: Payer: Self-pay | Admitting: Nurse Practitioner

## 2024-03-10 NOTE — Telephone Encounter (Signed)
 Pt cancelled appt for 03/20/24 stated that she got her labs done and would send you results and did not see the need to spend the money to come into office for a visit if her labs were still fine.  Informed pt that in office visits were required in order to keep refills of medications.  Pt stated she still wanted to cancel and did not wish to reschedule at this time.

## 2024-03-10 NOTE — Telephone Encounter (Signed)
 I will need to see her at least once per year for refills.  OR, she can go back to her PCP for management of her thyroid  moving forward.  Our schedule does fill up fast so she does need to make sure something is scheduled otherwise I will not be able to refill meds.

## 2024-03-11 LAB — LAB REPORT - SCANNED
EGFR: 104
Free T4: 1.21 ng/dL
TSH: 3.45 (ref 0.41–5.90)

## 2024-03-14 NOTE — Telephone Encounter (Signed)
 I just sent her labs to chart to be abstracted.

## 2024-03-14 NOTE — Telephone Encounter (Signed)
 noted

## 2024-03-20 ENCOUNTER — Ambulatory Visit: Admitting: Nurse Practitioner
